# Patient Record
Sex: Female | Born: 1935 | Race: White | Hispanic: No | Smoking: Former smoker
Health system: Southern US, Community
[De-identification: ages and names within clinical notes are randomized; demographics above are authoritative.]

## PROBLEM LIST (undated history)

## (undated) DIAGNOSIS — I739 Peripheral vascular disease, unspecified: Secondary | ICD-10-CM

## (undated) DIAGNOSIS — E785 Hyperlipidemia, unspecified: Secondary | ICD-10-CM

## (undated) DIAGNOSIS — N200 Calculus of kidney: Secondary | ICD-10-CM

## (undated) DIAGNOSIS — E119 Type 2 diabetes mellitus without complications: Secondary | ICD-10-CM

## (undated) DIAGNOSIS — I10 Essential (primary) hypertension: Secondary | ICD-10-CM

## (undated) HISTORY — PX: ABDOMINAL HYSTERECTOMY: SHX81

## (undated) HISTORY — PX: OTHER SURGICAL HISTORY: SHX169

## (undated) HISTORY — PX: ANKLE FRACTURE SURGERY: SHX122

---

## 1999-03-19 ENCOUNTER — Encounter: Payer: Self-pay | Admitting: Emergency Medicine

## 1999-03-19 ENCOUNTER — Encounter: Payer: Self-pay | Admitting: Internal Medicine

## 1999-03-19 ENCOUNTER — Inpatient Hospital Stay (HOSPITAL_COMMUNITY): Admission: EM | Admit: 1999-03-19 | Discharge: 1999-03-23 | Payer: Self-pay | Admitting: Emergency Medicine

## 1999-03-23 ENCOUNTER — Encounter: Payer: Self-pay | Admitting: Internal Medicine

## 1999-04-02 ENCOUNTER — Encounter: Admission: RE | Admit: 1999-04-02 | Discharge: 1999-04-02 | Payer: Self-pay | Admitting: Family Medicine

## 1999-05-12 ENCOUNTER — Ambulatory Visit (HOSPITAL_COMMUNITY): Admission: RE | Admit: 1999-05-12 | Discharge: 1999-05-12 | Payer: Self-pay | Admitting: Gastroenterology

## 1999-08-17 ENCOUNTER — Encounter: Admission: RE | Admit: 1999-08-17 | Discharge: 1999-08-17 | Payer: Self-pay | Admitting: Family Medicine

## 1999-08-19 ENCOUNTER — Encounter: Admission: RE | Admit: 1999-08-19 | Discharge: 1999-08-19 | Payer: Self-pay | Admitting: *Deleted

## 1999-08-19 ENCOUNTER — Encounter: Payer: Self-pay | Admitting: Sports Medicine

## 1999-08-20 ENCOUNTER — Other Ambulatory Visit: Admission: RE | Admit: 1999-08-20 | Discharge: 1999-08-20 | Payer: Self-pay | Admitting: *Deleted

## 1999-08-20 ENCOUNTER — Encounter: Admission: RE | Admit: 1999-08-20 | Discharge: 1999-08-20 | Payer: Self-pay | Admitting: Family Medicine

## 1999-08-27 ENCOUNTER — Encounter: Admission: RE | Admit: 1999-08-27 | Discharge: 1999-08-27 | Payer: Self-pay | Admitting: Family Medicine

## 2000-10-18 ENCOUNTER — Ambulatory Visit (HOSPITAL_COMMUNITY): Admission: RE | Admit: 2000-10-18 | Discharge: 2000-10-18 | Payer: Self-pay | Admitting: Obstetrics and Gynecology

## 2001-08-18 ENCOUNTER — Encounter: Payer: Self-pay | Admitting: Geriatric Medicine

## 2001-08-18 ENCOUNTER — Encounter: Admission: RE | Admit: 2001-08-18 | Discharge: 2001-08-18 | Payer: Self-pay | Admitting: Geriatric Medicine

## 2001-09-12 ENCOUNTER — Inpatient Hospital Stay (HOSPITAL_COMMUNITY): Admission: RE | Admit: 2001-09-12 | Discharge: 2001-09-14 | Payer: Self-pay | Admitting: Obstetrics and Gynecology

## 2002-11-20 ENCOUNTER — Encounter: Payer: Self-pay | Admitting: Geriatric Medicine

## 2002-11-20 ENCOUNTER — Encounter: Admission: RE | Admit: 2002-11-20 | Discharge: 2002-11-20 | Payer: Self-pay | Admitting: Geriatric Medicine

## 2004-09-15 ENCOUNTER — Encounter: Admission: RE | Admit: 2004-09-15 | Discharge: 2004-09-15 | Payer: Self-pay | Admitting: Geriatric Medicine

## 2004-09-18 ENCOUNTER — Inpatient Hospital Stay: Payer: Self-pay | Admitting: Internal Medicine

## 2004-09-18 ENCOUNTER — Encounter: Admission: RE | Admit: 2004-09-18 | Discharge: 2004-09-18 | Payer: Self-pay | Admitting: Geriatric Medicine

## 2005-03-04 ENCOUNTER — Ambulatory Visit: Payer: Self-pay | Admitting: Internal Medicine

## 2005-03-12 ENCOUNTER — Ambulatory Visit: Payer: Self-pay | Admitting: Internal Medicine

## 2005-10-19 ENCOUNTER — Inpatient Hospital Stay: Payer: Self-pay | Admitting: Internal Medicine

## 2005-12-06 ENCOUNTER — Ambulatory Visit: Payer: Self-pay | Admitting: Urology

## 2005-12-06 ENCOUNTER — Other Ambulatory Visit: Payer: Self-pay

## 2005-12-15 ENCOUNTER — Inpatient Hospital Stay: Payer: Self-pay | Admitting: Urology

## 2005-12-22 ENCOUNTER — Ambulatory Visit: Payer: Self-pay | Admitting: Urology

## 2006-01-03 ENCOUNTER — Emergency Department: Payer: Self-pay | Admitting: Emergency Medicine

## 2006-07-28 ENCOUNTER — Ambulatory Visit: Payer: Self-pay | Admitting: Internal Medicine

## 2007-11-12 IMAGING — XA IR NEPHROSTOMY
1 series · 11 of 11 positions shown · non-contrast
Comparison: none

REASON FOR EXAM: post pcnl
COMMENTS:

[Series 1: run · 11 of 11 slices shown]
[im 1/11]
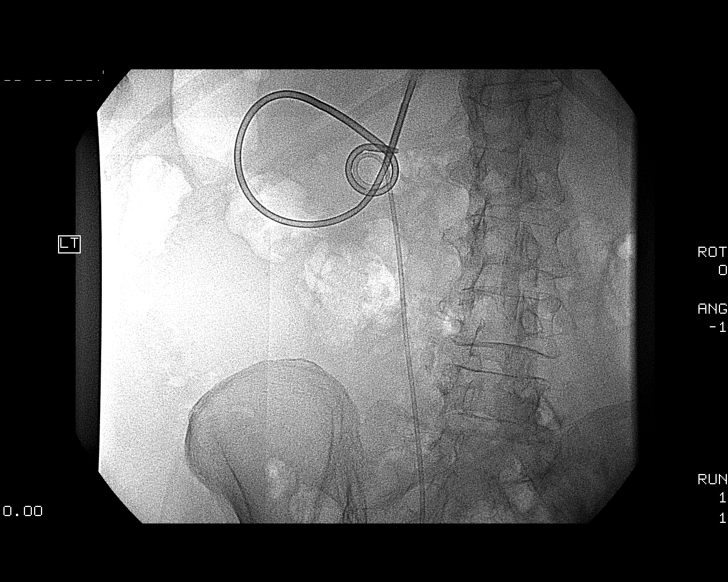
[im 2/11]
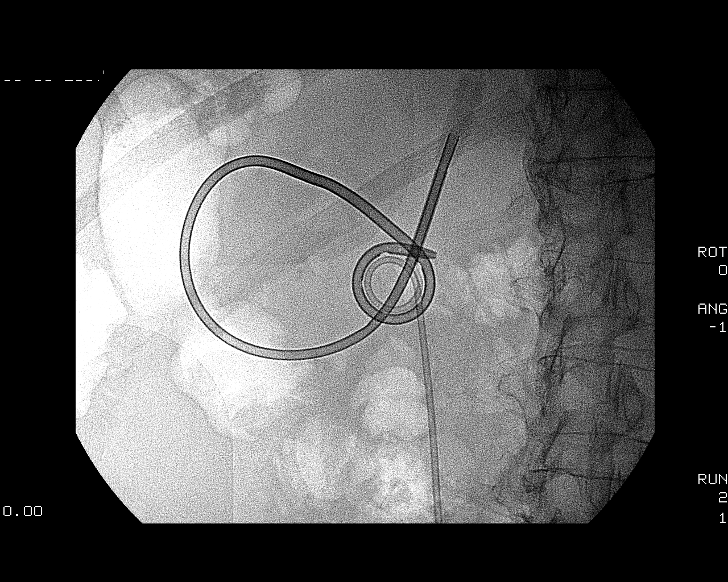
[im 3/11]
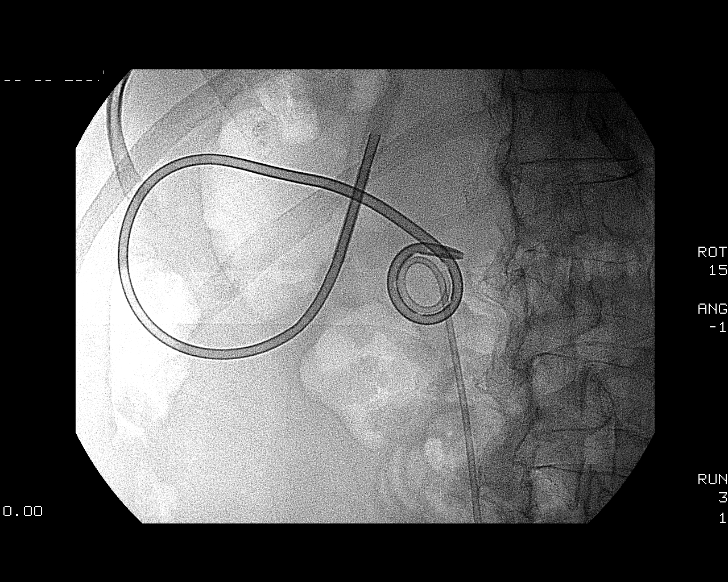
[im 4/11]
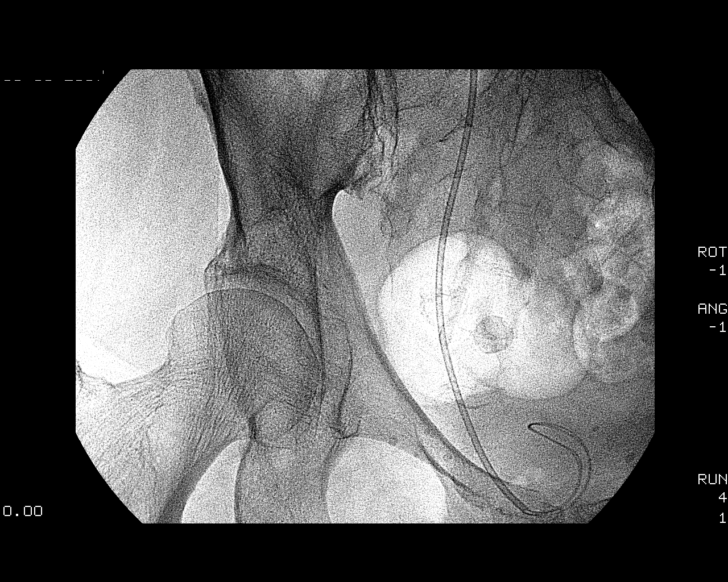
[im 5/11]
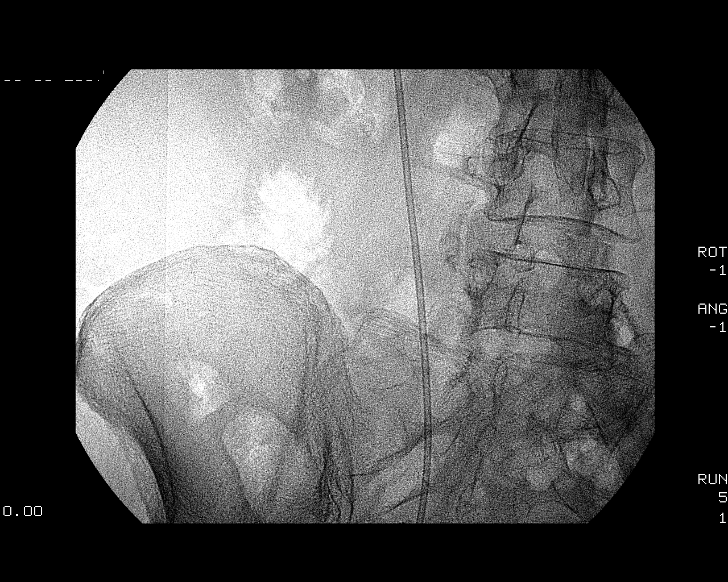
[im 6/11]
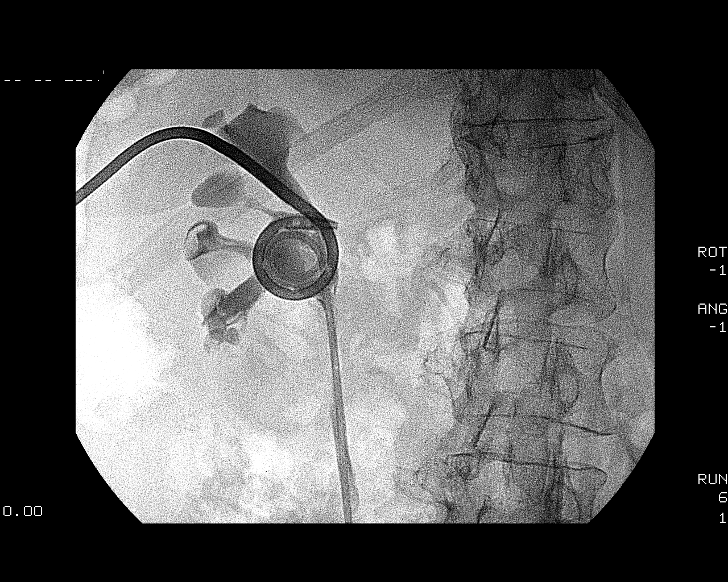
[im 7/11]
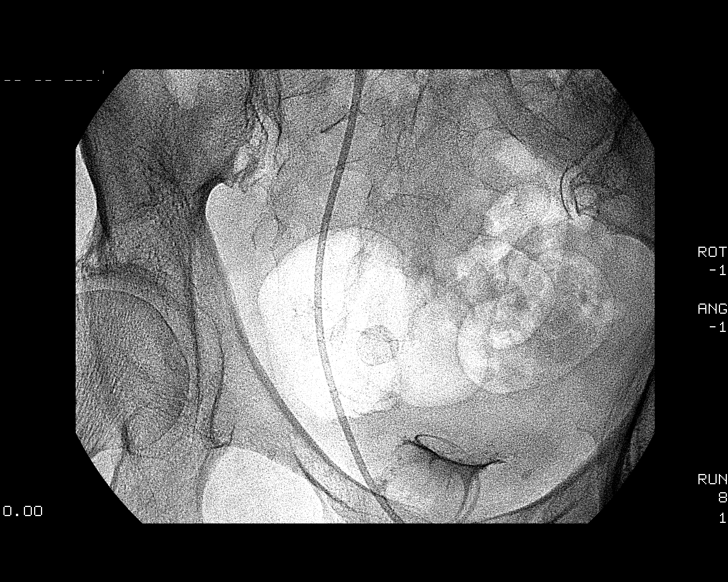
[im 8/11]
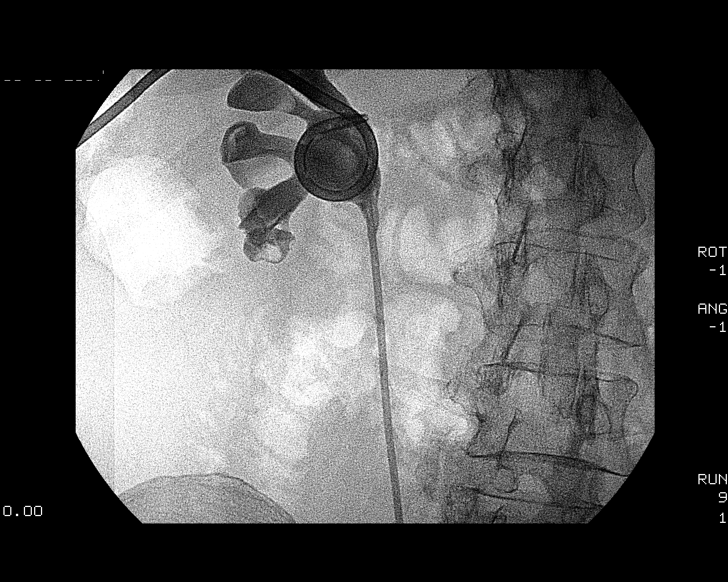
[im 9/11]
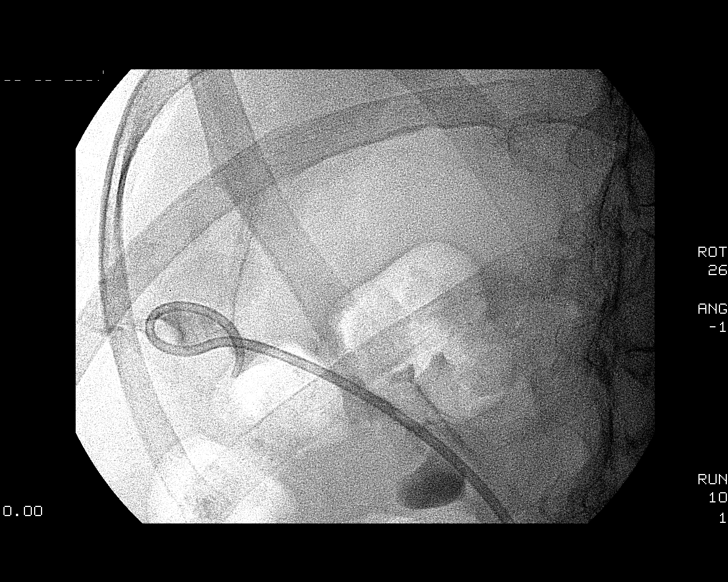
[im 10/11]
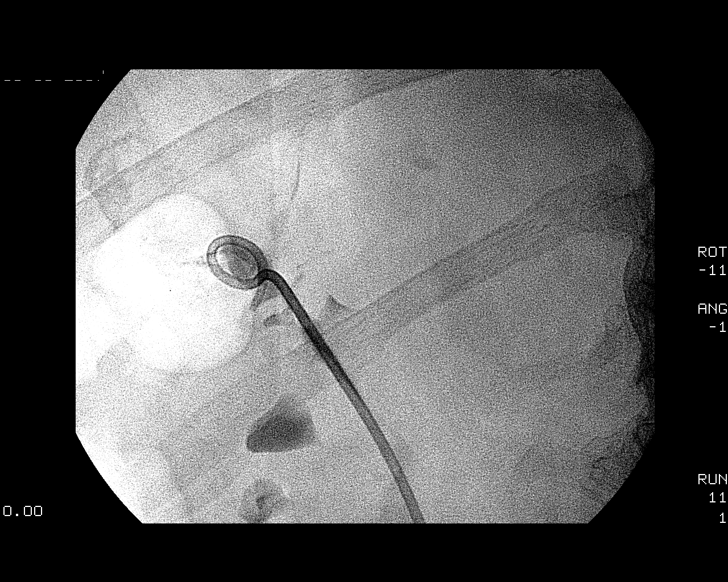
[im 11/11]
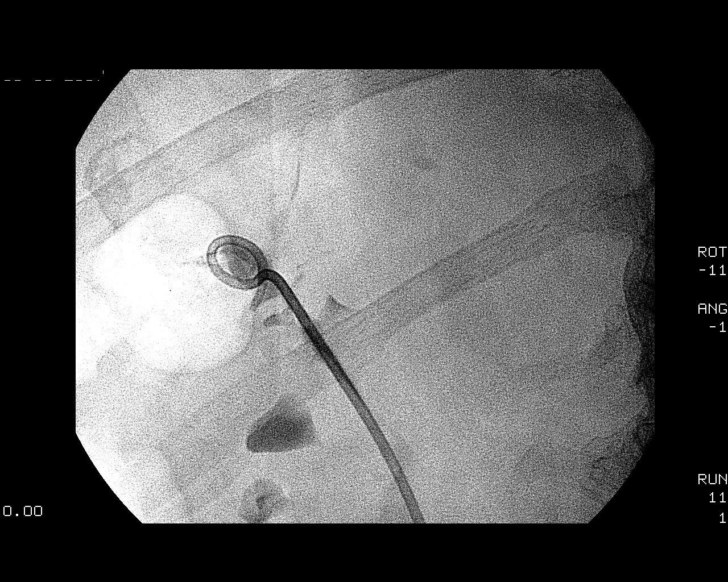

[11 of 11 positions shown; findings below may reference images not displayed]

PROCEDURE:     VAS - NEPHROSTOGRAM  - December 16, 2005 [DATE]

RESULT:     A nephrostogram was performed through the existing nephrostomy
catheter.  The renal collecting system and ureter are widely patent. No
prominent stones are present.  Therefore, as requested, the nephrostomy
catheter was removed.  There are no complications. It should be noted that
the existing ureteral stent was pulled slightly proximally by the
nephrostomy catheter, but its tip still remained in the upper pole
collecting system.  This was discussed with Dr. Azhar at the time of the
procedure
IMPRESSION: 1)Successful nephrostogram with wide patency of the renal collecting system
and ureter.

## 2008-06-25 ENCOUNTER — Ambulatory Visit: Payer: Self-pay | Admitting: Physician Assistant

## 2008-10-02 ENCOUNTER — Inpatient Hospital Stay: Payer: Self-pay | Admitting: Internal Medicine

## 2009-01-03 ENCOUNTER — Ambulatory Visit: Payer: Self-pay | Admitting: Internal Medicine

## 2009-01-10 ENCOUNTER — Ambulatory Visit: Payer: Self-pay | Admitting: Internal Medicine

## 2009-04-22 ENCOUNTER — Ambulatory Visit: Payer: Self-pay | Admitting: Urology

## 2009-04-24 ENCOUNTER — Ambulatory Visit: Payer: Self-pay | Admitting: Urology

## 2009-05-16 ENCOUNTER — Ambulatory Visit: Payer: Self-pay | Admitting: Urology

## 2009-05-21 ENCOUNTER — Ambulatory Visit: Payer: Self-pay | Admitting: Urology

## 2009-05-29 ENCOUNTER — Ambulatory Visit: Payer: Self-pay | Admitting: Urology

## 2009-06-12 ENCOUNTER — Ambulatory Visit: Payer: Self-pay | Admitting: Urology

## 2009-06-17 ENCOUNTER — Ambulatory Visit: Payer: Self-pay | Admitting: Urology

## 2009-10-07 ENCOUNTER — Ambulatory Visit: Payer: Self-pay | Admitting: Urology

## 2010-02-10 ENCOUNTER — Ambulatory Visit: Payer: Self-pay | Admitting: Internal Medicine

## 2010-09-15 ENCOUNTER — Ambulatory Visit: Payer: Self-pay | Admitting: Unknown Physician Specialty

## 2010-09-16 LAB — PATHOLOGY REPORT

## 2011-06-24 ENCOUNTER — Ambulatory Visit: Payer: Self-pay | Admitting: Internal Medicine

## 2012-07-26 ENCOUNTER — Ambulatory Visit: Payer: Self-pay | Admitting: Internal Medicine

## 2013-02-04 LAB — BASIC METABOLIC PANEL
Anion Gap: 5 — ABNORMAL LOW (ref 7–16)
BUN: 11 mg/dL (ref 7–18)
Chloride: 107 mmol/L (ref 98–107)
Co2: 29 mmol/L (ref 21–32)
Creatinine: 0.87 mg/dL (ref 0.60–1.30)
EGFR (Non-African Amer.): >60
Potassium: 4.4 mmol/L (ref 3.5–5.1)

## 2013-02-04 LAB — CBC
MCH: 32.9 pg (ref 26.0–34.0)
MCHC: 33.6 g/dL (ref 32.0–36.0)
MCV: 98 fL (ref 80–100)
Platelet: 183 10*3/uL (ref 150–440)
RBC: 3.98 10*6/uL (ref 3.80–5.20)
WBC: 14.3 10*3/uL — ABNORMAL HIGH (ref 3.6–11.0)

## 2013-02-05 ENCOUNTER — Observation Stay: Payer: Self-pay | Admitting: Internal Medicine

## 2013-02-06 LAB — CBC WITH DIFFERENTIAL/PLATELET
Eosinophil %: 0.9 %
HCT: 34.5 % — ABNORMAL LOW (ref 35.0–47.0)
HGB: 11.8 g/dL — ABNORMAL LOW (ref 12.0–16.0)
Lymphocyte %: 28.7 %
MCHC: 34.2 g/dL (ref 32.0–36.0)
MCV: 98 fL (ref 80–100)
Monocyte %: 7.8 %
Neutrophil #: 5.2 10*3/uL (ref 1.4–6.5)
Platelet: 161 10*3/uL (ref 150–440)
RBC: 3.51 10*6/uL — ABNORMAL LOW (ref 3.80–5.20)

## 2013-02-06 LAB — BASIC METABOLIC PANEL
Anion Gap: 4 — ABNORMAL LOW (ref 7–16)
Co2: 31 mmol/L (ref 21–32)
EGFR (African American): 60 — ABNORMAL LOW
Potassium: 4.4 mmol/L (ref 3.5–5.1)

## 2013-02-06 LAB — MAGNESIUM: Magnesium: 2 mg/dL

## 2013-10-01 ENCOUNTER — Ambulatory Visit: Payer: Self-pay | Admitting: Internal Medicine

## 2014-01-19 DIAGNOSIS — E119 Type 2 diabetes mellitus without complications: Secondary | ICD-10-CM | POA: Insufficient documentation

## 2014-01-19 DIAGNOSIS — I10 Essential (primary) hypertension: Secondary | ICD-10-CM | POA: Insufficient documentation

## 2014-11-29 NOTE — H&P (Signed)
PATIENT NAME:  [name] SachsLEWIS, [name] MR#:  [id number] DATE OF BIRTH:  08/16/1931  DATE OF ADMISSION:  02/05/2013  REFERRING PHYSICIAN: Dr. Margarita Lucas.   PRIMARY CARE PHYSICIAN: Dr. Einar CrowMarshall Lucas.   CHIEF COMPLAINT: Pain management.   HISTORY OF PRESENT ILLNESS: The patient is a 81 year old female with past medical history of hypertension, diabetes mellitus and hyperlipidemia. Was brought into the ER by her daughter after she sustained a fall and unable to ambulate. The patient went to the bathroom and while on her way back to her room, the patient's feet gave out and she fell and landed on her back. Denies any head trauma or injury. Following the fall, the patient was unable to get up, and she rested on the floor for a while. Eventually, she could not ambulate. Her daughter brought her into the ER, and the patient is diagnosed with a left-sided bimalleolar ankle fracture. The patient was evaluated by orthopedics and as this is nonoperable per ortho's opinion, hospitalist team is consulted regarding pain management and possible placement. During my examination, the patient denies any pain but complaining of nausea. Denies any chest pain or shortness of breath. Daughter and the patient both prefer going home after pain management and they are refusing placement at this point. No other complaints.    PAST MEDICAL HISTORY: Diabetes mellitus, hypertension, hyperlipidemia.   PAST SURGICAL HISTORY: Right ankle surgery following fracture, lithotripsy, hysterectomy.   ALLERGIES: She is allergic to CIPROFLOXACIN, SULFA DRUGS.    PSYCHOSOCIAL HISTORY: Lives with daughter. No smoking, alcohol or illicit drug usage.   FAMILY HISTORY: Father had colon cancer.   REVIEW OF SYSTEMS:  CONSTITUTIONAL: Denies any fever, fatigue. No pain.  EYES: Denies any double vision, blurry vision.  ENT: Denies any epistaxis, discharge, snoring, postnasal drip.  RESPIRATION: No COPD, wheezing, hemoptysis.  CARDIOVASCULAR: No chest  pain, palpitations, syncope.  GASTROINTESTINAL: Complaining of nausea. No vomiting or diarrhea. Denies any jaundice.  GENITOURINARY: No dysuria or hematuria.  GYNECOLOGY AND BREASTS: Denies breast mass or vaginal discharge. Status post hysterectomy.  ENDOCRINE: No polyuria or nocturia. Has history of diabetes mellitus. Denies any thyroid problems.  HEMATOLOGIC AND LYMPHATIC: Denies anemia, easy bruising or bleeding.  INTEGUMENTARY: No acne, rash, lesions.  MUSCULOSKELETAL: Denies any gout. Denies any back pain or shoulder pain.  NEUROLOGIC: Denies any history of vertigo, ataxia, dementia.  PSYCHIATRIC: No history of ADD, OCD, bipolar disorder.   PHYSICAL EXAMINATION:  VITAL SIGNS: Temperature 98.3, pulse 80 to 89, blood pressure initially 196/88 but eventually trended down to 166/73, respiratory rate 20, pulse ox 93% on room air.  GENERAL APPEARANCE: Not in any acute distress, resting comfortably. Moderately built and obese.  HEENT: Normocephalic. Pupils are equally reacting to light and accommodation. No scleral icterus. No conjunctival injection. No sinus tenderness. No postnasal drip. No pharyngeal exudate.  NECK: Supple. No JVD. No thyromegaly. No lymphadenopathy.  LUNGS: Clear to auscultation bilaterally. No accessory muscle use. No anterior chest wall tenderness on palpation.  CARDIAC: S1, S2 normal. Regular rate and rhythm. No gallops.  GASTROINTESTINAL: Soft. Bowel sounds are positive in all 4 quadrants. Nontender, nondistended. No masses felt. No hepatosplenomegaly.  NEUROLOGIC: Awake, alert, oriented x3. Cranial nerves II through XII are grossly intact. Motor and sensory are grossly intact. Reflexes are 2+.  SKIN: Warm to touch. Normal turgor. No rashes. No lesions.  BACK: No CVA tenderness.  EXTREMITIES: No edema. No cyanosis. No clubbing.  MUSCULOSKELETAL: The patient has sustained a left-sided bimalleolar ankle fracture which is nonoperable. Otherwise, no  joint effusion or  erythema. Tenderness in the left ankle.  PSYCHIATRIC: Normal mood and affect.   LABS AND IMAGING STUDIES: Glucose 143, BUN 11, creatinine 0.87, sodium 141, potassium 4.4, chloride 107, CO2 29. GFR greater than 60. Anion gap is 5. Serum osmolality 283. Calcium 9.4. WBC 14.3, hemoglobin 13.1, hematocrit 39.0, platelets 183. Left ankle x-ray revealed sustained spiral fracture of the distal fibular metaphysis. Horizontally oriented medial malleolar fracture with overlying soft tissue swelling. RIGHT ANKLE X-RAY: No evidence of acute fracture of the right ankle.   ASSESSMENT AND PLAN:  1. Left bimalleolar ankle fracture which is nonoperable as reported by Dr. Rosita Kea, orthopedics. Will provide pain management, and physical therapy consult is placed. Consult is placed to case management as well regarding discharge planning.  2. Hypertension: It is elevated, probably related with pain. Continue pain management and blood pressure medication.  3. Diabetes mellitus: The patient will be on sliding scale insulin.  4. Hyperlipidemia: Resume home medication.  5. Will provide her gastrointestinal and deep vein thrombosis prophylaxis.  6. Will transfer the patient to [facility] in a.m. to Dr. Gaynell Face Lucas's service.   The plan of care was discussed in detail with the patient. The patient is aware of the plan.   TOTAL TIME SPENT ON THE ADMISSION: 45 minutes in reviewing old medical records, discussion with the patient and ER staff, as well as dictation.   ____________________________ Ramonita Lab, MD ag:gb D: 02/05/2013 02:12:53 ET T: 02/05/2013 03:34:34 ET JOB#: [id number]  cc: Ramonita Lab, MD, <Dictator> Marya Amsler. Dareen Piano, MD Ramonita Lab MD ELECTRONICALLY SIGNED 02/16/2013 7:44

## 2014-11-29 NOTE — Consult Note (Signed)
Brief Consult Note: Diagnosis: Left bimalleolar ankle fracture, nondisplaced.   Patient was seen by consultant.   Recommend further assessment or treatment.   Discussed with Attending MD.   Comments: Discussed this case with Dr. Kennedy BuckerMichael Menz.  I am going to assume care for this patient.  Patient had a mechanical fall at home and was unable to stand after the injury.  Patient diagnosed with nondisplaced fracture of the left ankle and admitted to medical service.  Patient was splinted in the ER.  Patient cleared for discharge today by Dr. Dareen PianoAnderson.  Patient  being discharged home.  Daughter and grandson are at the patient's bedside.  Patient's medical history has been reviewed on EMR.  On exam, her skin is intact.  Swelling is controlled.  Patient can flex and extend toes weakly bilaterally which is her baseline.  She also has mild paresthesias bilaterally which is her baseline.  Patient is boarderline diabetic by her report.  She has palpable pedal pulses and no calf tenderness tenderness.  Xrays reveal two screws in right ankle with arthrosis changes in the right ankle joint, but no new fractures or dislocations.  The left ankle views show nondisplaced fractures of the medial and lateral malleoli which a symmetric mortise and no widening of the syndesmosis.  I have changed the patient to a short leg cast.  She is to remain [facility] until she follows up in my office in 2 weeks.  Electronic Signatures: Juanell FairlyKrasinski, Krysti Hickling (MD)  (Signed 01-Jul-14 13:09)  Authored: Brief Consult Note   Last Updated: 01-Jul-14 13:09 by Juanell FairlyKrasinski, Ijeoma Loor (MD)

## 2016-04-10 ENCOUNTER — Emergency Department
Admission: EM | Admit: 2016-04-10 | Discharge: 2016-04-10 | Disposition: A | Discharge status: Home / Self Care | Payer: Medicare Other | Attending: Emergency Medicine | Admitting: Emergency Medicine

## 2016-04-10 ENCOUNTER — Encounter: Payer: Self-pay | Admitting: Emergency Medicine

## 2016-04-10 ENCOUNTER — Emergency Department: Payer: Medicare Other

## 2016-04-10 DIAGNOSIS — Z79899 Other long term (current) drug therapy: Secondary | ICD-10-CM | POA: Diagnosis not present

## 2016-04-10 DIAGNOSIS — Z87891 Personal history of nicotine dependence: Secondary | ICD-10-CM | POA: Diagnosis not present

## 2016-04-10 DIAGNOSIS — E860 Dehydration: Secondary | ICD-10-CM

## 2016-04-10 DIAGNOSIS — E119 Type 2 diabetes mellitus without complications: Secondary | ICD-10-CM | POA: Insufficient documentation

## 2016-04-10 DIAGNOSIS — I10 Essential (primary) hypertension: Secondary | ICD-10-CM | POA: Diagnosis not present

## 2016-04-10 DIAGNOSIS — E876 Hypokalemia: Secondary | ICD-10-CM | POA: Insufficient documentation

## 2016-04-10 DIAGNOSIS — K5732 Diverticulitis of large intestine without perforation or abscess without bleeding: Secondary | ICD-10-CM | POA: Diagnosis not present

## 2016-04-10 DIAGNOSIS — R112 Nausea with vomiting, unspecified: Secondary | ICD-10-CM | POA: Diagnosis present

## 2016-04-10 HISTORY — DX: Calculus of kidney: N20.0

## 2016-04-10 HISTORY — DX: Essential (primary) hypertension: I10

## 2016-04-10 HISTORY — DX: Type 2 diabetes mellitus without complications: E11.9

## 2016-04-10 LAB — CBC
HEMATOCRIT: 38.1 % (ref 35.0–47.0)
Hemoglobin: 13.2 g/dL (ref 12.0–16.0)
MCH: 33.7 pg (ref 26.0–34.0)
MCHC: 34.8 g/dL (ref 32.0–36.0)
MCV: 96.8 fL (ref 80.0–100.0)
Platelets: 172 10*3/uL (ref 150–440)
RBC: 3.93 MIL/uL (ref 3.80–5.20)
RDW: 13.4 % (ref 11.5–14.5)
WBC: 10.5 10*3/uL (ref 3.6–11.0)

## 2016-04-10 LAB — URINALYSIS COMPLETE WITH MICROSCOPIC (ARMC ONLY)
BILIRUBIN URINE: NEGATIVE
GLUCOSE, UA: NEGATIVE mg/dL
Hgb urine dipstick: NEGATIVE
Ketones, ur: NEGATIVE mg/dL
Nitrite: NEGATIVE
Protein, ur: NEGATIVE mg/dL
Specific Gravity, Urine: 1.015 (ref 1.005–1.030)
pH: 5 (ref 5.0–8.0)

## 2016-04-10 LAB — COMPREHENSIVE METABOLIC PANEL
ALT: 12 U/L — ABNORMAL LOW (ref 14–54)
AST: 16 U/L (ref 15–41)
Albumin: 3.8 g/dL (ref 3.5–5.0)
Alkaline Phosphatase: 66 U/L (ref 38–126)
Anion gap: 9 (ref 5–15)
BILIRUBIN TOTAL: 0.5 mg/dL (ref 0.3–1.2)
BUN: 19 mg/dL (ref 6–20)
CO2: 19 mmol/L — ABNORMAL LOW (ref 22–32)
CREATININE: 0.97 mg/dL (ref 0.44–1.00)
Calcium: 9.1 mg/dL (ref 8.9–10.3)
Chloride: 110 mmol/L (ref 101–111)
GFR calc Af Amer: >60 mL/min (ref >60)
GFR, EST NON AFRICAN AMERICAN: 53 mL/min — AB (ref >60)
Glucose, Bld: 144 mg/dL — ABNORMAL HIGH (ref 65–99)
POTASSIUM: 3.1 mmol/L — AB (ref 3.5–5.1)
Sodium: 138 mmol/L (ref 135–145)
TOTAL PROTEIN: 7.3 g/dL (ref 6.5–8.1)

## 2016-04-10 LAB — LIPASE, BLOOD: LIPASE: 47 U/L (ref 11–51)

## 2016-04-10 MED ORDER — POTASSIUM CHLORIDE 40 MEQ/15ML (20%) PO SOLN: 5.0000 mL | Freq: Every day | ORAL | 0 refills | Status: DC

## 2016-04-10 MED ORDER — MAGNESIUM SULFATE IN D5W 1-5 GM/100ML-% IV SOLN
1.0000 g | Freq: Once | INTRAVENOUS | Status: AC
  Administered 2016-04-10: 1 g via INTRAVENOUS
  Filled 2016-04-10: qty 100

## 2016-04-10 MED ORDER — ONDANSETRON HCL 4 MG/2ML IJ SOLN
4.0000 mg | Freq: Once | INTRAMUSCULAR | Status: AC
  Administered 2016-04-10: 4 mg via INTRAVENOUS
  Filled 2016-04-10: qty 2

## 2016-04-10 MED ORDER — CIPROFLOXACIN HCL 500 MG PO TABS: 500.0000 mg | ORAL_TABLET | Freq: Two times a day (BID) | ORAL | 0 refills | Status: DC

## 2016-04-10 MED ORDER — CIPROFLOXACIN HCL 500 MG PO TABS
500.0000 mg | ORAL_TABLET | ORAL | Status: AC
  Administered 2016-04-10: 500 mg via ORAL
  Filled 2016-04-10: qty 1

## 2016-04-10 MED ORDER — IOPAMIDOL (ISOVUE-300) INJECTION 61%
30.0000 mL | Freq: Once | INTRAVENOUS | Status: AC | PRN
  Administered 2016-04-10: 30 mL via ORAL

## 2016-04-10 MED ORDER — POTASSIUM CHLORIDE 10 MEQ/100ML IV SOLN
10.0000 meq | INTRAVENOUS | Status: AC
  Administered 2016-04-10: 10 meq via INTRAVENOUS
  Filled 2016-04-10 (×2): qty 100

## 2016-04-10 MED ORDER — ACETAMINOPHEN 160 MG/5ML PO SOLN
650.0000 mg | Freq: Once | ORAL | Status: AC
  Administered 2016-04-10: 650 mg via ORAL
  Filled 2016-04-10: qty 25

## 2016-04-10 MED ORDER — METRONIDAZOLE 500 MG PO TABS
500.0000 mg | ORAL_TABLET | ORAL | Status: AC
  Administered 2016-04-10: 500 mg via ORAL
  Filled 2016-04-10: qty 1

## 2016-04-10 MED ORDER — IOPAMIDOL (ISOVUE-300) INJECTION 61%
100.0000 mL | Freq: Once | INTRAVENOUS | Status: AC | PRN
  Administered 2016-04-10: 100 mL via INTRAVENOUS

## 2016-04-10 MED ORDER — ONDANSETRON 4 MG PO TBDP: 4.0000 mg | ORAL_TABLET | Freq: Four times a day (QID) | ORAL | 0 refills | Status: DC | PRN

## 2016-04-10 MED ORDER — METRONIDAZOLE 500 MG PO TABS: 500.0000 mg | ORAL_TABLET | Freq: Three times a day (TID) | ORAL | 0 refills | Status: DC

## 2016-04-10 MED ORDER — SODIUM CHLORIDE 0.9 % IV BOLUS (SEPSIS)
1000.0000 mL | Freq: Once | INTRAVENOUS | Status: AC
  Administered 2016-04-10: 1000 mL via INTRAVENOUS

## 2016-04-10 NOTE — ED Notes (Signed)
Pt states she has been having diarrhea x 2 weeks. Has tried multiple OTC meds to stop diarrhea but has not helped.  Vomiting started this morning x2 which pt describes as white in appearance and sour tasting.  Pt having increased weakness as well.  Pt states this morning she felt like she was going to pass out.  Pt has had abdominal pain as well which comes and goes.  Pt has hx of esophageal stretching and polpys in colon as well. Pt has not taken her meds this morning. Per daughter, pt has appt Tuesday with PCP.

## 2016-04-10 NOTE — Discharge Instructions (Signed)
We believe your symptoms are caused by diverticulitis.  Most of the time this condition (please read through the included information) can be cured with outpatient antibiotics.  Please take the full course of prescribed medication(s) and follow up with the doctors recommended above.  Return to the ED if your abdominal pain worsens or fails to improve, you develop bloody vomiting, bloody diarrhea, you are unable to tolerate fluids due to vomiting, fever greater than 101, or other symptoms that concern you.  Follow-up with your doctor on Tuesday as planned.

## 2016-04-10 NOTE — ED Provider Notes (Signed)
[facility] Emergency Department Provider Note   ____________________________________________   First MD Initiated Contact with Patient 04/10/16 [phone]     (approximate)  I have reviewed the triage vital signs and the nursing notes.   HISTORY  Chief Complaint Emesis and Diarrhea    HPI [name] Lucas is a 82 y.o. female presents for evaluation of "diarrhea". And vomiting  Patient and her daughter both present, both very pleasant and report that for the last 2 weeks she's had about 2 loose nonbloody, nonblack stools 1 about 4 in the morning and then occasionally another one in the mid morning. In associated with some occasional intermittent lower abdominal discomfort, and then this morning she got up she vomited twice which she describes as a sort of whitish color nonbloody.  Not in any pain, no nausea though she had some this morning before vomiting. No recent antibiotics. No recent infections. No fevers. She came today as she is starting to feel "lightheaded" when she is walking in the house. She feels "dehydrated"  No chest pain, shortness of breath, or headache. No travel. No recent antibiotics or hospital stay.  She is a diabetic. Reports compliant with her medicine.  Past Medical History:  Diagnosis Date  . Diabetes mellitus without complication (HCC)   . Hypertension   . Kidney stones     There are no active problems to display for this patient.   Past Surgical History:  Procedure Laterality Date  . ABDOMINAL HYSTERECTOMY    . ANKLE FRACTURE SURGERY Bilateral   . esophageal dilitation    . kidney stone removal      Prior to Admission medications   Medication Sig Start Date End Date Taking? Authorizing Provider  ciprofloxacin (CIPRO) 500 MG tablet Take 1 tablet (500 mg total) by mouth 2 (two) times daily. 04/10/16   Sharyn Creamer, MD  metroNIDAZOLE (FLAGYL) 500 MG tablet Take 1 tablet (500 mg total) by mouth 3 (three) times daily. 04/10/16    Sharyn Creamer, MD  ondansetron (ZOFRAN ODT) 4 MG disintegrating tablet Take 1 tablet (4 mg total) by mouth every 6 (six) hours as needed for nausea or vomiting. 04/10/16   Sharyn Creamer, MD  Potassium Chloride 40 MEQ/15ML (20%) SOLN Take 5 mLs by mouth daily. 04/10/16   Sharyn Creamer, MD    Allergies Sulfa antibiotics  No family history on file.  Social History Social History  Substance Use Topics  . Smoking status: Former Games developer  . Smokeless tobacco: Not on file  . Alcohol use No    Review of Systems Constitutional: No fever/chills Eyes: No visual changes. ENT: No sore throat. Cardiovascular: Denies chest pain. Respiratory: Denies shortness of breath. Gastrointestinal: No abdominal pain at present though will have a slight crampy discomfort over the lower abdomen at times over the last 2 weeks.    No constipation. Genitourinary: Negative for dysuria. Musculoskeletal: Negative for back pain. Skin: Negative for rash. Neurological: Negative for headaches, focal weakness or numbness.  10-point ROS otherwise negative.  ____________________________________________   PHYSICAL EXAM:  VITAL SIGNS: ED Triage Vitals  Enc Vitals Group     BP 04/10/16 0817 (!) 140/57     Pulse Rate 04/10/16 0817 76     Resp 04/10/16 0817 18     Temp 04/10/16 0817 98 F (36.7 C)     Temp Source 04/10/16 0817 Oral     SpO2 04/10/16 0817 95 %     Weight 04/10/16 0819 169 lb (76.7 kg)  Height 04/10/16 0819 5\' 3"  (1.6 m)     Head Circumference --      Peak Flow --      Pain Score --      Pain Loc --      Pain Edu? --      Excl. in GC? --     Constitutional: Alert and oriented. Well appearing and in no acute distress. Eyes: Conjunctivae are normal. PERRL. EOMI. Head: Atraumatic. Nose: No congestion/rhinnorhea. Mouth/Throat: Mucous membranes are Modestly dry.  Oropharynx non-erythematous. Neck: No stridor.   Cardiovascular: Normal rate, regular rhythm. Grossly normal heart sounds.  Good peripheral  circulation. Respiratory: Normal respiratory effort.  No retractions. Lungs CTAB. Gastrointestinal: Soft and nontender except for some mild discomfort without evidence of peritonitis suprapubically and over the right lower abdomen. No distention. No abdominal bruits. No CVA tenderness. Musculoskeletal: No lower extremity tenderness nor edema.  No joint effusions. Neurologic:  Normal speech and language. No gross focal neurologic deficits are appreciated. Normal cranial nerves. No pronator drift. Skin:  Skin is warm, dry and intact. No rash noted. Slight increase in skin turgor. Psychiatric: Mood and affect are normal. Speech and behavior are normal.  ____________________________________________   LABS (all labs ordered are listed, but only abnormal results are displayed)  Labs Reviewed  COMPREHENSIVE METABOLIC PANEL - Abnormal; Notable for the following:       Result Value   Potassium 3.1 (*)    CO2 19 (*)    Glucose, Bld 144 (*)    ALT 12 (*)    GFR calc non Af Amer 53 (*)    All other components within normal limits  URINALYSIS COMPLETEWITH MICROSCOPIC ([facility] ONLY) - Abnormal; Notable for the following:    Color, Urine YELLOW (*)    APPearance HAZY (*)    Leukocytes, UA 1+ (*)    Bacteria, UA RARE (*)    Squamous Epithelial / LPF 0-5 (*)    All other components within normal limits  GASTROINTESTINAL PANEL BY PCR, STOOL (REPLACES STOOL CULTURE)  C DIFFICILE QUICK SCREEN W PCR REFLEX  CBC  LIPASE, BLOOD   ____________________________________________  EKG  Reviewed and to revive me at 8:40 AM Heart rate 75 QRS 120 QTc 480 Reviewed and interpreted as normal sinus rhythm, moderate evidence of left ventricular hypertrophy without ischemic abnormality ____________________________________________  RADIOLOGY  Ct Abdomen Pelvis W Contrast  Result Date: 04/10/2016 CLINICAL DATA:  Nausea, vomiting, diarrhea, lower abdominal pain. History of renal calculi and prior  hysterectomy. EXAM: CT ABDOMEN AND PELVIS WITH CONTRAST TECHNIQUE: Multidetector CT imaging of the abdomen and pelvis was performed using the standard protocol following bolus administration of intravenous contrast. CONTRAST:  100mL ISOVUE-300 IOPAMIDOL (ISOVUE-300) INJECTION 61%, 30mL ISOVUE-300 IOPAMIDOL (ISOVUE-300) INJECTION 61% COMPARISON:  None. FINDINGS: Lower chest:  No acute findings. Hepatobiliary: The liver demonstrates multiple punctate calcified granulomata. Inferior right lobe shows a subcapsular small cyst measuring 6 mm. The gallbladder and biliary tree are unremarkable. Pancreas: The pancreas appears atrophic.  No inflammation or mass. Spleen: Within normal limits in size and appearance. Adrenals/Urinary Tract: The adrenal glands appear normal. The left kidney is markedly atrophic. Both kidneys demonstrate no evidence of hydronephrosis or visualize calculi. Ureters and bladder are unremarkable with decompression of the bladder. Stomach/Bowel: Large hiatal hernia present containing a portion of the proximal stomach as well as fat. There is some fluid in the posterior and right aspect of the hiatal hernia sac which is nonspecific and likely not of significance. Diffuse diverticulosis noted  of the colon. Focal inflammation along the anterior and superior aspect of the mid sigmoid colon likely is reflective of mild acute diverticulitis. No extraluminal air or associated focal abscess is identified. Component of esophagitis or even gastritis of the proximal stomach cannot be excluded. Vascular/Lymphatic: No enlarged lymph nodes are seen. The abdominal aorta shows calcified plaque without evidence of aneurysm. Reproductive: No mass or other significant abnormality. Other: None. Musculoskeletal:  No suspicious bone lesions identified. IMPRESSION: 1. Evidence of sigmoid colonic diverticulitis without free air or focal abscess. 2. Large hiatal hernia containing the proximal stomach as well as fat. The hernia  sac contains some dependent fluid which is nonspecific but could potentially imply a component of proximal gastritis or distal esophagitis. Electronically Signed   By: Irish Lack M.D.   On: 04/10/2016 10:59    ____________________________________________   PROCEDURES  Procedure(s) performed: None  Procedures  Critical Care performed: No  ____________________________________________   INITIAL IMPRESSION / ASSESSMENT AND PLAN / ED COURSE  Pertinent labs & imaging results that were available during my care of the patient were reviewed by me and considered in my medical decision making (see chart for details).  Differential diagnosis includes but is not limited to, abdominal perforation, aortic dissection, cholecystitis, appendicitis, diverticulitis, colitis, esophagitis/gastritis, kidney stone, pyelonephritis, urinary tract infection, aortic aneurysm. All are considered in decision and treatment plan. Based upon the patient's presentation and risk factors, we will evaluate for primary abdominal etiology including infection. Symptoms do not appear consistent with bowel obstruction, however potentially could represent a mild diverticulitis, viral process, or other infectious diarrhea suspected.  Discussed risks and benefits and will proceed with hydration, nausea medicine, and CT of the abdomen and pelvis to further evaluate. Stable hemodynamics with a relatively reassuring exam without evidence of acute abdomen. No neurologic cardiac or pulmonary symptoms.   Clinical Course   ----------------------------------------- 2:15 PM on 04/10/2016 -----------------------------------------  Patient reports she feels well. CT consistent with mild diverticulitis without evidence of perforation or complication. The patient is not having ongoing discomfort, vomiting, or evidence supporting need for inpatient hospitalization. Discussed with the patient, as well as her family at the bedside and  they're comfortable going home with a plan for close follow-up which ER he has arranged Tuesday with careful return precautions.   ____________________________________________   FINAL CLINICAL IMPRESSION(S) / ED DIAGNOSES  Final diagnoses:  Dehydration  Sigmoid diverticulitis  Hypokalemia      NEW MEDICATIONS STARTED DURING THIS VISIT:  New Prescriptions   CIPROFLOXACIN (CIPRO) 500 MG TABLET    Take 1 tablet (500 mg total) by mouth 2 (two) times daily.   METRONIDAZOLE (FLAGYL) 500 MG TABLET    Take 1 tablet (500 mg total) by mouth 3 (three) times daily.   ONDANSETRON (ZOFRAN ODT) 4 MG DISINTEGRATING TABLET    Take 1 tablet (4 mg total) by mouth every 6 (six) hours as needed for nausea or vomiting.   POTASSIUM CHLORIDE 40 MEQ/15ML (20%) SOLN    Take 5 mLs by mouth daily.     Note:  This document was prepared using Dragon voice recognition software and may include unintentional dictation errors.     Sharyn Creamer, MD 04/10/16 [phone]

## 2016-04-10 NOTE — ED Notes (Signed)
Patient transported to CT 

## 2016-04-10 NOTE — ED Notes (Signed)
MD at bedside. 

## 2016-04-10 NOTE — ED Triage Notes (Signed)
States diarrhea x 2 weeks, vomiting began yesterday, denies abd pain.

## 2016-04-10 NOTE — ED Notes (Signed)
CT notified pt completed contrast.  

## 2017-12-09 ENCOUNTER — Emergency Department: Payer: Medicare Other

## 2017-12-09 ENCOUNTER — Emergency Department
Admission: EM | Admit: 2017-12-09 | Discharge: 2017-12-09 | Disposition: A | Discharge status: Home / Self Care | Payer: Medicare Other | Attending: Emergency Medicine | Admitting: Emergency Medicine

## 2017-12-09 ENCOUNTER — Encounter: Payer: Self-pay | Admitting: Emergency Medicine

## 2017-12-09 ENCOUNTER — Other Ambulatory Visit: Payer: Self-pay

## 2017-12-09 DIAGNOSIS — S99912A Unspecified injury of left ankle, initial encounter: Secondary | ICD-10-CM | POA: Diagnosis present

## 2017-12-09 DIAGNOSIS — S93602A Unspecified sprain of left foot, initial encounter: Secondary | ICD-10-CM | POA: Diagnosis not present

## 2017-12-09 DIAGNOSIS — I10 Essential (primary) hypertension: Secondary | ICD-10-CM | POA: Insufficient documentation

## 2017-12-09 DIAGNOSIS — Z79899 Other long term (current) drug therapy: Secondary | ICD-10-CM | POA: Insufficient documentation

## 2017-12-09 DIAGNOSIS — E119 Type 2 diabetes mellitus without complications: Secondary | ICD-10-CM | POA: Diagnosis not present

## 2017-12-09 DIAGNOSIS — S80212A Abrasion, left knee, initial encounter: Secondary | ICD-10-CM | POA: Diagnosis not present

## 2017-12-09 DIAGNOSIS — S93402A Sprain of unspecified ligament of left ankle, initial encounter: Secondary | ICD-10-CM | POA: Insufficient documentation

## 2017-12-09 DIAGNOSIS — W0110XA Fall on same level from slipping, tripping and stumbling with subsequent striking against unspecified object, initial encounter: Secondary | ICD-10-CM | POA: Insufficient documentation

## 2017-12-09 DIAGNOSIS — Y939 Activity, unspecified: Secondary | ICD-10-CM | POA: Diagnosis not present

## 2017-12-09 DIAGNOSIS — S80211A Abrasion, right knee, initial encounter: Secondary | ICD-10-CM | POA: Diagnosis not present

## 2017-12-09 DIAGNOSIS — Y929 Unspecified place or not applicable: Secondary | ICD-10-CM | POA: Insufficient documentation

## 2017-12-09 DIAGNOSIS — S0990XA Unspecified injury of head, initial encounter: Secondary | ICD-10-CM | POA: Insufficient documentation

## 2017-12-09 DIAGNOSIS — Y998 Other external cause status: Secondary | ICD-10-CM | POA: Diagnosis not present

## 2017-12-09 DIAGNOSIS — Z87891 Personal history of nicotine dependence: Secondary | ICD-10-CM | POA: Insufficient documentation

## 2017-12-09 DIAGNOSIS — W19XXXA Unspecified fall, initial encounter: Secondary | ICD-10-CM

## 2017-12-09 NOTE — ED Notes (Signed)
Ace wrap applied to left ankle. Pt made aware or RICE and verbalized understanding.

## 2017-12-09 NOTE — ED Provider Notes (Signed)
[facility] Emergency Department Provider Note  ____________________________________________   First MD Initiated Contact with Patient 12/09/17 1945     (approximate)  I have reviewed the triage vital signs and the nursing notes.   HISTORY  Chief Complaint Fall   HPI [name] Lucas is a 84 y.o. female history of diabetes and hypertension who is presenting to the emergency department after a fall.  She says that she has been having weakness with frequent "giving out" of her left ankle since she broke it 2 years ago.  She says that the left ankle "gave out" this morning and she fell to her left side hitting her head on the left parietal region.  She does not report any loss of consciousness.  Denies any headache or neck pain at this time.  Denies any chest or abdominal pain.  Says that she has been experiencing worsening pain throughout the day in her left ankle as well as proximal, dorsal foot and came to the emergency department for further evaluation.  Patient is not on any blood thinners.  Past Medical History:  Diagnosis Date  . Diabetes mellitus without complication (HCC)   . Hypertension   . Kidney stones     There are no active problems to display for this patient.   Past Surgical History:  Procedure Laterality Date  . ABDOMINAL HYSTERECTOMY    . ANKLE FRACTURE SURGERY Bilateral   . esophageal dilitation    . kidney stone removal      Prior to Admission medications   Medication Sig Start Date End Date Taking? Authorizing Provider  ciprofloxacin (CIPRO) 500 MG tablet Take 1 tablet (500 mg total) by mouth 2 (two) times daily. 04/10/16   Sharyn Creamer, MD  metroNIDAZOLE (FLAGYL) 500 MG tablet Take 1 tablet (500 mg total) by mouth 3 (three) times daily. 04/10/16   Sharyn Creamer, MD  ondansetron (ZOFRAN ODT) 4 MG disintegrating tablet Take 1 tablet (4 mg total) by mouth every 6 (six) hours as needed for nausea or vomiting. 04/10/16   Sharyn Creamer, MD    Potassium Chloride 40 MEQ/15ML (20%) SOLN Take 5 mLs by mouth daily. 04/10/16   Sharyn Creamer, MD    Allergies Sulfa antibiotics  No family history on file.  Social History Social History   Tobacco Use  . Smoking status: Former Smoker  Substance Use Topics  . Alcohol use: No  . Drug use: Not on file    Review of Systems  Constitutional: No fever/chills Eyes: No visual changes. ENT: No sore throat. Cardiovascular: Denies chest pain. Respiratory: Denies shortness of breath. Gastrointestinal: No abdominal pain.  No nausea, no vomiting.  No diarrhea.  No constipation. Genitourinary: Negative for dysuria. Musculoskeletal: Negative for back pain. Skin: Negative for rash. Neurological: Negative for headaches, focal weakness or numbness.   ____________________________________________   PHYSICAL EXAM:  VITAL SIGNS: ED Triage Vitals  Enc Vitals Group     BP 12/09/17 1829 (!) 130/50     Pulse Rate 12/09/17 1829 84     Resp 12/09/17 1829 16     Temp 12/09/17 1829 99.2 F (37.3 C)     Temp Source 12/09/17 1829 Oral     SpO2 12/09/17 1829 95 %     Weight 12/09/17 1828 169 lb (76.7 kg)     Height --      Head Circumference --      Peak Flow --      Pain Score 12/09/17 1828 7  Pain Loc --      Pain Edu? --      Excl. in GC? --     Constitutional: Alert and oriented. Well appearing and in no acute distress. Eyes: Conjunctivae are normal.  Head: 2 x 3 cm raised hematoma to the left parieto-occipital region without any depression, bogginess or tenderness to palpation.  No overlying abrasion. Nose: No congestion/rhinnorhea. Mouth/Throat: Mucous membranes are moist.  Neck: No stridor.   Cardiovascular: Normal rate, regular rhythm. Grossly normal heart sounds.  Good peripheral circulation with intact dorsalis pedis pulses bilaterally. Respiratory: Normal respiratory effort.  No retractions. Lungs CTAB. Gastrointestinal: Soft and nontender. No  distention. Musculoskeletal:  Left lateral malleolus with tenderness to palpation.  Also tenderness palpation over the navicular.  Mild swelling to both the medial and lateral malleolar lie.  Patient is neurovascularly intact distal to the sites of the injuries.  Neurologic:  Normal speech and language. No gross focal neurologic deficits are appreciated. Skin: 2 x 3 cm superficial abrasions to the bilateral anterior knees.  Patient not complaining of knee pain. Psychiatric: Mood and affect are normal. Speech and behavior are normal.  ____________________________________________   LABS (all labs ordered are listed, but only abnormal results are displayed)  Labs Reviewed - No data to display ____________________________________________  EKG   ____________________________________________  RADIOLOGY  Left foot as well as ankle without acute finding.  CT brain without acute finding. ____________________________________________   PROCEDURES  Procedure(s) performed:   Procedures  Critical Care performed:   ____________________________________________   INITIAL IMPRESSION / ASSESSMENT AND PLAN / ED COURSE  Pertinent labs & imaging results that were available during my care of the patient were reviewed by me and considered in my medical decision making (see chart for details).  DDX: Abrasion, cephalhematoma, skull fracture, and cranial hemorrhage, ankle fracture, foot fracture, foot sprain, ankle sprain As part of my medical decision making, I reviewed the following data within the electronic MEDICAL RECORD NUMBER Notes from prior ED visits  ----------------------------------------- 8:38 PM on 12/09/2017 -----------------------------------------  Patient without evidence of acute fracture.  Likely sprain.  Will Ace wrap.  Recommended to keep the ankle elevated as well as use ice and Tylenol.  Patient as well as family are understanding of the diagnosis as well as treatment plan  willing to comply.  We reviewed the images.  Patient says that she has had her last tetanus shot within the last 10 years. ____________________________________________   FINAL CLINICAL IMPRESSION(S) / ED DIAGNOSES  Ankle sprain.  Foot sprain.  Abrasions.    NEW MEDICATIONS STARTED DURING THIS VISIT:  New Prescriptions   No medications on file     Note:  This document was prepared using Dragon voice recognition software and may include unintentional dictation errors.     Myrna Blazer, MD 12/09/17 2039

## 2017-12-09 NOTE — ED Triage Notes (Addendum)
Pt to ED via POV, pt states that she fell around 1100 this morning, pt states that her foot gave out. Pt hit her head and has a knot where she hit it, pt denies LOC. Pt is also c/o left ankle pain. Pt denies being on blood thinners. Swelling noted in the left ankle. Pt is in NAD at this time. Pt has abrasion to both knees, bleeding is controlled at this time.

## 2018-08-31 ENCOUNTER — Emergency Department: Payer: Medicare Other

## 2018-08-31 ENCOUNTER — Other Ambulatory Visit: Payer: Self-pay

## 2018-08-31 ENCOUNTER — Inpatient Hospital Stay
Admission: EM | Admit: 2018-08-31 | Discharge: 2018-09-04 | DRG: 871 | Disposition: A | Discharge status: Home / Self Care | Payer: Medicare Other | Attending: Internal Medicine | Admitting: Internal Medicine

## 2018-08-31 DIAGNOSIS — Z7984 Long term (current) use of oral hypoglycemic drugs: Secondary | ICD-10-CM | POA: Diagnosis not present

## 2018-08-31 DIAGNOSIS — J189 Pneumonia, unspecified organism: Secondary | ICD-10-CM | POA: Diagnosis present

## 2018-08-31 DIAGNOSIS — A419 Sepsis, unspecified organism: Secondary | ICD-10-CM | POA: Diagnosis not present

## 2018-08-31 DIAGNOSIS — J9601 Acute respiratory failure with hypoxia: Secondary | ICD-10-CM | POA: Diagnosis present

## 2018-08-31 DIAGNOSIS — Z87442 Personal history of urinary calculi: Secondary | ICD-10-CM | POA: Diagnosis not present

## 2018-08-31 DIAGNOSIS — E119 Type 2 diabetes mellitus without complications: Secondary | ICD-10-CM | POA: Diagnosis present

## 2018-08-31 DIAGNOSIS — Z882 Allergy status to sulfonamides status: Secondary | ICD-10-CM

## 2018-08-31 DIAGNOSIS — I10 Essential (primary) hypertension: Secondary | ICD-10-CM | POA: Diagnosis present

## 2018-08-31 DIAGNOSIS — Z87891 Personal history of nicotine dependence: Secondary | ICD-10-CM

## 2018-08-31 DIAGNOSIS — R0902 Hypoxemia: Secondary | ICD-10-CM

## 2018-08-31 DIAGNOSIS — Z79899 Other long term (current) drug therapy: Secondary | ICD-10-CM | POA: Diagnosis not present

## 2018-08-31 DIAGNOSIS — Z90710 Acquired absence of both cervix and uterus: Secondary | ICD-10-CM

## 2018-08-31 DIAGNOSIS — N390 Urinary tract infection, site not specified: Secondary | ICD-10-CM | POA: Diagnosis present

## 2018-08-31 DIAGNOSIS — J181 Lobar pneumonia, unspecified organism: Secondary | ICD-10-CM

## 2018-08-31 LAB — CBC WITH DIFFERENTIAL/PLATELET
Abs Immature Granulocytes: 0.06 10*3/uL (ref 0.00–0.07)
Basophils Absolute: 0.1 10*3/uL (ref 0.0–0.1)
Basophils Relative: 0 %
Eosinophils Absolute: 0.1 10*3/uL (ref 0.0–0.5)
Eosinophils Relative: 1 %
HCT: 42 % (ref 36.0–46.0)
HEMOGLOBIN: 13.2 g/dL (ref 12.0–15.0)
Immature Granulocytes: 0 %
LYMPHS ABS: 1.1 10*3/uL (ref 0.7–4.0)
LYMPHS PCT: 8 %
MCH: 32 pg (ref 26.0–34.0)
MCHC: 31.4 g/dL (ref 30.0–36.0)
MCV: 101.9 fL — ABNORMAL HIGH (ref 80.0–100.0)
Monocytes Absolute: 1 10*3/uL (ref 0.1–1.0)
Monocytes Relative: 7 %
Neutro Abs: 11.5 10*3/uL — ABNORMAL HIGH (ref 1.7–7.7)
Neutrophils Relative %: 84 %
Platelets: 218 10*3/uL (ref 150–400)
RBC: 4.12 MIL/uL (ref 3.87–5.11)
RDW: 12.8 % (ref 11.5–15.5)
WBC: 13.9 10*3/uL — ABNORMAL HIGH (ref 4.0–10.5)
nRBC: 0 % (ref 0.0–0.2)

## 2018-08-31 LAB — BLOOD GAS, VENOUS
Patient temperature: 37
pCO2, Ven: 49 mmHg (ref 44.0–60.0)
pH, Ven: 7.36 (ref 7.250–7.430)
pO2, Ven: 40 mmHg (ref 32.0–45.0)

## 2018-08-31 LAB — BRAIN NATRIURETIC PEPTIDE: B NATRIURETIC PEPTIDE 5: 92 pg/mL (ref 0.0–100.0)

## 2018-08-31 LAB — URINALYSIS, ROUTINE W REFLEX MICROSCOPIC
BILIRUBIN URINE: NEGATIVE
Glucose, UA: NEGATIVE mg/dL
Hgb urine dipstick: NEGATIVE
Ketones, ur: NEGATIVE mg/dL
Leukocytes, UA: NEGATIVE
Nitrite: NEGATIVE
Protein, ur: NEGATIVE mg/dL
Specific Gravity, Urine: 1.043 — ABNORMAL HIGH (ref 1.005–1.030)
pH: 5 (ref 5.0–8.0)

## 2018-08-31 LAB — COMPREHENSIVE METABOLIC PANEL
ALT: 10 U/L (ref 0–44)
ANION GAP: 8 (ref 5–15)
AST: 15 U/L (ref 15–41)
Albumin: 3.7 g/dL (ref 3.5–5.0)
Alkaline Phosphatase: 72 U/L (ref 38–126)
BUN: 15 mg/dL (ref 8–23)
CO2: 27 mmol/L (ref 22–32)
CREATININE: 1.04 mg/dL — AB (ref 0.44–1.00)
Calcium: 9.5 mg/dL (ref 8.9–10.3)
Chloride: 105 mmol/L (ref 98–111)
GFR calc Af Amer: 57 mL/min — ABNORMAL LOW (ref >60)
GFR, EST NON AFRICAN AMERICAN: 49 mL/min — AB (ref >60)
Glucose, Bld: 155 mg/dL — ABNORMAL HIGH (ref 70–99)
Potassium: 3.5 mmol/L (ref 3.5–5.1)
Sodium: 140 mmol/L (ref 135–145)
Total Bilirubin: 0.8 mg/dL (ref 0.3–1.2)
Total Protein: 8.2 g/dL — ABNORMAL HIGH (ref 6.5–8.1)

## 2018-08-31 LAB — TROPONIN I: Troponin I: <0.03 ng/mL (ref <0.03)

## 2018-08-31 LAB — GLUCOSE, CAPILLARY
GLUCOSE-CAPILLARY: 124 mg/dL — AB (ref 70–99)
Glucose-Capillary: 101 mg/dL — ABNORMAL HIGH (ref 70–99)
Glucose-Capillary: 125 mg/dL — ABNORMAL HIGH (ref 70–99)

## 2018-08-31 LAB — INFLUENZA PANEL BY PCR (TYPE A & B)
Influenza A By PCR: NEGATIVE
Influenza B By PCR: NEGATIVE

## 2018-08-31 LAB — LACTIC ACID, PLASMA: Lactic Acid, Venous: 1.6 mmol/L (ref 0.5–1.9)

## 2018-08-31 MED ORDER — SODIUM CHLORIDE 0.9 % IV SOLN
500.0000 mg | Freq: Once | INTRAVENOUS | Status: AC
  Administered 2018-08-31: 500 mg via INTRAVENOUS
  Filled 2018-08-31: qty 500

## 2018-08-31 MED ORDER — VITAMIN D3 25 MCG (1000 UNIT) PO TABS
2000.0000 [IU] | ORAL_TABLET | Freq: Every day | ORAL | Status: DC
  Administered 2018-09-01 – 2018-09-04 (×4): 2000 [IU] via ORAL
  Filled 2018-08-31 (×6): qty 2

## 2018-08-31 MED ORDER — ALBUTEROL SULFATE (2.5 MG/3ML) 0.083% IN NEBU: 2.5000 mg | INHALATION_SOLUTION | RESPIRATORY_TRACT | Status: DC | PRN

## 2018-08-31 MED ORDER — IPRATROPIUM-ALBUTEROL 0.5-2.5 (3) MG/3ML IN SOLN
3.0000 mL | Freq: Once | RESPIRATORY_TRACT | Status: AC
  Administered 2018-08-31: 3 mL via RESPIRATORY_TRACT
  Filled 2018-08-31: qty 3

## 2018-08-31 MED ORDER — ENOXAPARIN SODIUM 40 MG/0.4ML ~~LOC~~ SOLN
40.0000 mg | SUBCUTANEOUS | Status: DC
  Administered 2018-08-31 – 2018-09-03 (×4): 40 mg via SUBCUTANEOUS
  Filled 2018-08-31 (×4): qty 0.4

## 2018-08-31 MED ORDER — SODIUM CHLORIDE 0.9 % IV SOLN
Freq: Once | INTRAVENOUS | Status: AC
  Administered 2018-08-31: 17:00:00 via INTRAVENOUS

## 2018-08-31 MED ORDER — ONDANSETRON HCL 4 MG/2ML IJ SOLN
4.0000 mg | Freq: Once | INTRAMUSCULAR | Status: DC
  Filled 2018-08-31: qty 2

## 2018-08-31 MED ORDER — SODIUM CHLORIDE 0.9 % IV BOLUS (SEPSIS)
1500.0000 mL | Freq: Once | INTRAVENOUS | Status: AC
  Administered 2018-08-31: 1500 mL via INTRAVENOUS

## 2018-08-31 MED ORDER — ONDANSETRON HCL 4 MG/2ML IJ SOLN
4.0000 mg | Freq: Four times a day (QID) | INTRAMUSCULAR | Status: DC | PRN
  Administered 2018-08-31: 4 mg via INTRAVENOUS
  Filled 2018-08-31: qty 2

## 2018-08-31 MED ORDER — INSULIN ASPART 100 UNIT/ML ~~LOC~~ SOLN
0.0000 [IU] | Freq: Three times a day (TID) | SUBCUTANEOUS | Status: DC
  Administered 2018-08-31: 1 [IU] via SUBCUTANEOUS
  Administered 2018-09-02: 2 [IU] via SUBCUTANEOUS
  Administered 2018-09-03: 1 [IU] via SUBCUTANEOUS
  Filled 2018-08-31 (×2): qty 1

## 2018-08-31 MED ORDER — SODIUM CHLORIDE 0.9 % IV SOLN
1.0000 g | Freq: Once | INTRAVENOUS | Status: AC
  Administered 2018-08-31: 1 g via INTRAVENOUS
  Filled 2018-08-31: qty 10

## 2018-08-31 MED ORDER — ACETAMINOPHEN 325 MG PO TABS
650.0000 mg | ORAL_TABLET | Freq: Four times a day (QID) | ORAL | Status: DC | PRN
  Administered 2018-09-01 – 2018-09-02 (×3): 650 mg via ORAL
  Filled 2018-08-31 (×4): qty 2

## 2018-08-31 MED ORDER — PANTOPRAZOLE SODIUM 40 MG PO TBEC
40.0000 mg | DELAYED_RELEASE_TABLET | Freq: Every day | ORAL | Status: DC
  Administered 2018-08-31 – 2018-09-03 (×4): 40 mg via ORAL
  Filled 2018-08-31 (×4): qty 1

## 2018-08-31 MED ORDER — AZITHROMYCIN 250 MG PO TABS
250.0000 mg | ORAL_TABLET | Freq: Every day | ORAL | Status: DC
  Administered 2018-09-01 – 2018-09-04 (×4): 250 mg via ORAL
  Filled 2018-08-31 (×4): qty 1

## 2018-08-31 MED ORDER — IOHEXOL 350 MG/ML SOLN
75.0000 mL | Freq: Once | INTRAVENOUS | Status: AC | PRN
  Administered 2018-08-31: 60 mL via INTRAVENOUS

## 2018-08-31 MED ORDER — ACETAMINOPHEN 650 MG RE SUPP: 650.0000 mg | Freq: Four times a day (QID) | RECTAL | Status: DC | PRN

## 2018-08-31 MED ORDER — SODIUM CHLORIDE 0.9 % IV SOLN
1.0000 g | INTRAVENOUS | Status: DC
  Administered 2018-09-01 – 2018-09-04 (×4): 1 g via INTRAVENOUS
  Filled 2018-08-31: qty 1
  Filled 2018-08-31: qty 10
  Filled 2018-08-31 (×2): qty 1

## 2018-08-31 MED ORDER — TRAMADOL HCL 50 MG PO TABS
50.0000 mg | ORAL_TABLET | Freq: Four times a day (QID) | ORAL | Status: DC | PRN
  Administered 2018-08-31: 50 mg via ORAL
  Filled 2018-08-31: qty 1

## 2018-08-31 MED ORDER — ONDANSETRON HCL 4 MG PO TABS: 4.0000 mg | ORAL_TABLET | Freq: Four times a day (QID) | ORAL | Status: DC | PRN

## 2018-08-31 NOTE — ED Triage Notes (Signed)
Right sided chest pain and chest congestion that started today. Nonproductive cough. Pain worse with deep breathing.

## 2018-08-31 NOTE — ED Notes (Signed)
Daugther at bedside. Pt states she was at home feeling  "weak and nauseous" along with chest tightness. VSS. NAD noted at this time.

## 2018-08-31 NOTE — H&P (Signed)
[facility] [place] at [facility]   PATIENT NAME: [name] SachsRachel Avey    MR#:  [id number]  DATE OF BIRTH:  08/28/1931  DATE OF ADMISSION:  08/31/2018  PRIMARY CARE PHYSICIAN: Lauro RegulusAnderson, Marshall W, MD   REQUESTING/REFERRING PHYSICIAN: Dr Sharma CovertNorman  CHIEF COMPLAINT:   Chest pain and cough for 1 1/2 days HISTORY OF PRESENT ILLNESS:  [name] Lucas  is a 85 y.o. female with a known history of diabetes, hypertension comes to the emergency room accompanied by daughter and grandson after she started having chest and congestion for two days. She woke up today had some dry cough started hurting in her back and had difficulty breathing. She was found to have saturations in the upper 80s when she came to the ER along with tachycardia. She was found to have left lower lobe pneumonia. Patient received IV fluids, IV Rocephin and Zithromax.  Patient was found of elevated white count of 13,000 along with tachycardia and positive chest x-ray consistent with sepsis secondary to left lower lobe pneumonia.  PAST MEDICAL HISTORY:   Past Medical History:  Diagnosis Date  . Diabetes mellitus without complication (HCC)   . Hypertension   . Kidney stones     PAST SURGICAL HISTOIRY:   Past Surgical History:  Procedure Laterality Date  . ABDOMINAL HYSTERECTOMY    . ANKLE FRACTURE SURGERY Bilateral   . esophageal dilitation    . kidney stone removal      SOCIAL HISTORY:   Social History   Tobacco Use  . Smoking status: Former Smoker  Substance Use Topics  . Alcohol use: No    FAMILY HISTORY:  No family history on file.  DRUG ALLERGIES:   Allergies  Allergen Reactions  . Sulfa Antibiotics Nausea And Vomiting    REVIEW OF SYSTEMS:  Review of Systems  Constitutional: Negative for chills, fever and weight loss.  HENT: Negative for ear discharge, ear pain and nosebleeds.   Eyes: Negative for blurred vision, pain and discharge.  Respiratory: Positive for cough and  shortness of breath. Negative for sputum production, wheezing and stridor.   Cardiovascular: Positive for chest pain. Negative for palpitations, orthopnea and PND.  Gastrointestinal: Negative for abdominal pain, diarrhea, nausea and vomiting.  Genitourinary: Negative for frequency and urgency.  Musculoskeletal: Negative for back pain and joint pain.  Neurological: Positive for weakness. Negative for sensory change, speech change and focal weakness.  Psychiatric/Behavioral: Negative for depression and hallucinations. The patient is not nervous/anxious.      MEDICATIONS AT HOME:   Prior to Admission medications   Medication Sig Start Date End Date Taking? Authorizing Provider  Cholecalciferol (VITAMIN D3) 50 MCG (2000 UT) capsule Take 2,000 Units by mouth daily.   Yes [provider]  [employer] VITAMIN B12 1000 MCG tablet Take 1,000 mcg by mouth daily. 08/25/18  Yes [provider]  glimepiride (AMARYL) 1 MG tablet Take 1 mg by mouth daily. 07/12/18  Yes [provider]  nitrofurantoin, macrocrystal-monohydrate, (MACROBID) 100 MG capsule Take 1 capsule by mouth 2 (two) times daily. 08/25/18  Yes [provider]  olmesartan-hydrochlorothiazide (BENICAR HCT) 20-12.5 MG tablet Take 1 tablet by mouth daily. 08/25/18 08/25/19 Yes [provider]  omeprazole (PRILOSEC) 20 MG capsule Take 20 mg by mouth 2 (two) times daily. 10/22/14  Yes [provider]      VITAL SIGNS:  Blood pressure (!) 105/56, pulse 84, temperature 98.9 F (37.2 C), temperature source Oral, resp. rate 13, height 5\' 3"  (1.6 m),  weight 69.9 kg, SpO2 98 %.  PHYSICAL EXAMINATION:  GENERAL:  85 y.o.-year-old patient lying in the bed with no acute distress.  EYES: Pupils equal, round, reactive to light and accommodation. No scleral icterus. Extraocular muscles intact.  HEENT: Head atraumatic, normocephalic. Oropharynx and nasopharynx clear.  NECK:  Supple, no jugular venous distention. No  thyroid enlargement, no tenderness.  LUNGS: Normal breath sounds bilaterally, no wheezing,bibasilar rales,no rhonchi or crepitation. No use of accessory muscles of respiration.  CARDIOVASCULAR: S1, S2 normal. No murmurs, rubs, or gallops.  ABDOMEN: Soft, nontender, nondistended. Bowel sounds present. No organomegaly or mass.  EXTREMITIES: No pedal edema, cyanosis, or clubbing.  NEUROLOGIC: Cranial nerves II through XII are intact. Muscle strength 5/5 in all extremities. Sensation intact. Gait not checked.  PSYCHIATRIC: The patient is alert and oriented x 3.  SKIN: No obvious rash, lesion, or ulcer.   LABORATORY PANEL:   CBC Recent Labs  Lab 08/31/18 1010  WBC 13.9*  HGB 13.2  HCT 42.0  PLT 218   ------------------------------------------------------------------------------------------------------------------  Chemistries  Recent Labs  Lab 08/31/18 1010  NA 140  K 3.5  CL 105  CO2 27  GLUCOSE 155*  BUN 15  CREATININE 1.04*  CALCIUM 9.5  AST 15  ALT 10  ALKPHOS 72  BILITOT 0.8   ------------------------------------------------------------------------------------------------------------------  Cardiac Enzymes Recent Labs  Lab 08/31/18 1010  TROPONINI <0.03   ------------------------------------------------------------------------------------------------------------------  RADIOLOGY:  Dg Chest 2 View  Result Date: 08/31/2018 CLINICAL DATA:  Right-sided chest pain EXAM: CHEST - 2 VIEW COMPARISON:  10/02/2008 FINDINGS: Cardiac shadow is stable. Aortic calcifications are seen. Hiatal hernia is noted and stable. The lungs are well aerated bilaterally. Mild left basilar infiltrate is seen. Associated small effusion is noted as well. Old left rib fractures are noted. IMPRESSION: Mild left basilar infiltrate with associated effusion. Stable hiatal hernia. Electronically Signed   By: Alcide CleverMark  Lukens M.D.   On: 08/31/2018 10:17   Ct Angio Chest Pe W And/or Wo Contrast  Result  Date: 08/31/2018 CLINICAL DATA:  Shortness of breath right-sided chest pain EXAM: CT ANGIOGRAPHY CHEST WITH CONTRAST TECHNIQUE: Multidetector CT imaging of the chest was performed using the standard protocol during bolus administration of intravenous contrast. Multiplanar CT image reconstructions and MIPs were obtained to evaluate the vascular anatomy. CONTRAST:  60mL OMNIPAQUE IOHEXOL 350 MG/ML SOLN COMPARISON:  Chest radiograph August 31, 2018 FINDINGS: Cardiovascular: There is no demonstrable pulmonary embolus. There is no thoracic aortic aneurysm or dissection. There is calcification in the proximal visualized great vessels. There is aortic atherosclerosis. There are foci of coronary artery calcification. There is no pericardial effusion or pericardial thickening evident. Mediastinum/Nodes: Visualized thyroid appears unremarkable. There is no appreciable thoracic adenopathy. There is a focal hiatal hernia. Lungs/Pleura: There are small partially loculated pleural effusions in each lung base. There is atelectatic change in each lung base with mild consolidation associated in the lateral and posterior segments of the left lower lobe. Upper Abdomen: There are occasional small calcified liver granulomas. There is upper abdominal aortic atherosclerosis. Visualized upper abdominal aortic structures otherwise appear unremarkable. Musculoskeletal: There is anterior wedging of the T10 vertebral body. No blastic or lytic bone lesions are evident. No chest wall lesions are appreciable. Review of the MIP images confirms the above findings. IMPRESSION: 1. No demonstrable pulmonary embolus. No thoracic aortic aneurysm or dissection. There is aortic atherosclerosis as well as foci of great vessel and coronary artery calcification. 2. Small pleural effusions bilaterally with bibasilar atelectasis. There appears to be a  degree of superimposed pneumonia in portions of the left lower lobe as well. 3.  Moderate hiatal hernia. 4.   No appreciable thoracic adenopathy. 5.  Scattered calcified liver granulomas. 6.  Wedge fracture of the T10 vertebral body. Aortic Atherosclerosis (ICD10-I70.0). Electronically Signed   By: Bretta Bang III M.D.   On: 08/31/2018 11:51    EKG:   Sinus tachycardia IMPRESSION AND PLAN:   Zuha Grieco  is a 85 y.o. female with a known history of diabetes, hypertension comes to the emergency room accompanied by daughter and grandson after she started having chest and congestion for two days.  1. Sepsis secondary left lower lobe pneumonia -patient presented with cough and chest congestion along with chest pain and shortness of breath -positive chest x-ray tachycardia and elevated white count -IV Rocephin and Zithromax. Follow blood culture and CBC  2. Diabetes -sliding scale insulin  3. Leukocytosis due to pneumonia -follow-up CBC  4. DVT prophylaxis subcu Lovenox  Above was discussed with patient patient's daughter    All the records are reviewed and case discussed with ED provider.   CODE STATUS: full  TOTAL TIME TAKING CARE OF THIS PATIENT: **50 minutes.    Enedina Finner M.D on 08/31/2018 at 12:33 PM  Between 7am to 6pm - Pager - [phone]  After 6pm go to [email or web address] - password [email or web address] [facility]  [facility]  Office  [phone]  CC: Primary care physician; Lauro Regulus, MD

## 2018-08-31 NOTE — ED Notes (Signed)
Patient transported to X-ray 

## 2018-08-31 NOTE — ED Provider Notes (Signed)
[facility] Emergency Department Provider Note  ____________________________________________  Time seen: Approximately 9:52 AM  I have reviewed the triage vital signs and the nursing notes.   HISTORY  Chief Complaint Chest Pain and Cough    HPI [name] Lucas is a 85 y.o. female with a history of HTN and DM, without any prior underlying pulmonary or cardiac disease, presenting with cough and shortness of breath, right-sided chest pain.  The patient lives at home with her daughter and states that for the past several nights, she has been feeling short of breath when she lays down.  This morning, the shortness of breath seemed worse and upon arrival to the emergency department she was hypoxic to 89% on room air, with improvement to 94% on 2 L nasal cannula.  She has had a nonproductive cough without any congestion or rhinorrhea, sore throat or ear pain.  She has not been having any fevers or chills.  She has been taking an antibiotic, she does not know which one, for UTI.  She also reports right-sided chest pain which is worse with deep breaths. No LE edema or calf pain.  FH: no hx blood clots  Past Medical History:  Diagnosis Date  . Diabetes mellitus without complication (HCC)   . Hypertension   . Kidney stones     There are no active problems to display for this patient.   Past Surgical History:  Procedure Laterality Date  . ABDOMINAL HYSTERECTOMY    . ANKLE FRACTURE SURGERY Bilateral   . esophageal dilitation    . kidney stone removal      Current Outpatient Rx  . Order #: [id number] Class: Historical Med  . Order #: [id number] Class: Historical Med  . Order #: [id number] Class: Historical Med  . Order #: [id number] Class: Historical Med  . Order #: [id number] Class: Historical Med  . Order #: [id number] Class: Historical Med  . Order #: [id number] Class: Print  . Order #: [id number] Class: Print  . Order #: [id number] Class: Print  . Order #: [id number] Class:  Print    Allergies Sulfa antibiotics  No family history on file.  Social History Social History   Tobacco Use  . Smoking status: Former Smoker  Substance Use Topics  . Alcohol use: No  . Drug use: Not on file    Review of Systems Constitutional: No fever/chills. Eyes: No visual changes. ENT: No sore throat. No congestion or rhinorrhea. Cardiovascular: + chest pain. Denies palpitations. Respiratory: Denies shortness of breath.  + cough. Gastrointestinal: No abdominal pain.  + nausea, no vomiting.  No diarrhea.  No constipation. Genitourinary: Negative for dysuria.  + tx for UTI. Musculoskeletal: Negative for back pain. No LE edema or calf pain. Skin: Negative for rash. Neurological: Negative for headaches. No focal numbness, tingling or weakness.     ____________________________________________   PHYSICAL EXAM:  VITAL SIGNS: ED Triage Vitals [08/31/18 0939]  Enc Vitals Group     BP (!) 129/58     Pulse Rate (!) 108     Resp 20     Temp 98.9 F (37.2 C)     Temp Source Oral     SpO2 (!) 89 %     Weight 154 lb (69.9 kg)     Height 5\' 3"  (1.6 m)     Head Circumference      Peak Flow      Pain Score 6     Pain Loc      Pain Edu?      Excl. in  GC?     Constitutional: Alert and oriented. Answers questions appropriately. Eyes: Conjunctivae are normal.  EOMI. No scleral icterus. Head: Atraumatic. Nose: No congestion/rhinnorhea. Mouth/Throat: Mucous membranes are moist.  Neck: No stridor.  Supple.  No JVD. Cardiovascular: Normal rate, regular rhythm. No murmurs, rubs or gallops.  Respiratory: The patient is not tachypneic and is able to maintain oxygen saturations of 94% on room air.  She does have some end expiratory wheezing without any rales or rhonchi.  She clutches her right chest with deep breaths. Gastrointestinal: Soft, nontender and nondistended.  No guarding or rebound.  No peritoneal signs. Musculoskeletal: No LE edema. No ttp in the calves or  palpable cords.  Negative Homan's sign. Neurologic:  A&Ox3.  Speech is clear.  Face and smile are symmetric.  EOMI.  Moves all extremities well. Skin:  Skin is warm, dry and intact. No rash noted. Psychiatric: Mood and affect are normal. Speech and behavior are normal.  Normal judgement.  ____________________________________________   LABS (all labs ordered are listed, but only abnormal results are displayed)  Labs Reviewed  COMPREHENSIVE METABOLIC PANEL - Abnormal; Notable for the following components:      Result Value   Glucose, Bld 155 (*)    Creatinine, Ser 1.04 (*)    Total Protein 8.2 (*)    GFR calc non Af Amer 49 (*)    GFR calc Af Amer 57 (*)    All other components within normal limits  CBC WITH DIFFERENTIAL/PLATELET - Abnormal; Notable for the following components:   WBC 13.9 (*)    MCV 101.9 (*)    Neutro Abs 11.5 (*)    All other components within normal limits  CULTURE, BLOOD (ROUTINE X 2)  CULTURE, BLOOD (ROUTINE X 2)  LACTIC ACID, PLASMA  TROPONIN I  BLOOD GAS, VENOUS  LACTIC ACID, PLASMA  URINALYSIS, ROUTINE W REFLEX MICROSCOPIC  BRAIN NATRIURETIC PEPTIDE  INFLUENZA PANEL BY PCR (TYPE A & B)   ____________________________________________  EKG  ED ECG REPORT I, Anne-Caroline Sharma CovertNorman, the attending physician, personally viewed and interpreted this ECG.   Date: 08/31/2018  EKG Time: 937  Rate: 109  Rhythm: sinus tachycardia; + LVH  Axis: normal  Intervals:none  ST&T Change: No STEMI  ____________________________________________  RADIOLOGY  Dg Chest 2 View  Result Date: 08/31/2018 CLINICAL DATA:  Right-sided chest pain EXAM: CHEST - 2 VIEW COMPARISON:  10/02/2008 FINDINGS: Cardiac shadow is stable. Aortic calcifications are seen. Hiatal hernia is noted and stable. The lungs are well aerated bilaterally. Mild left basilar infiltrate is seen. Associated small effusion is noted as well. Old left rib fractures are noted. IMPRESSION: Mild left basilar  infiltrate with associated effusion. Stable hiatal hernia. Electronically Signed   By: Alcide CleverMark  Lukens M.D.   On: 08/31/2018 10:17    ____________________________________________   PROCEDURES  Procedure(s) performed: None  Procedures  Critical Care performed: Yes, see critical care note(s) ____________________________________________   INITIAL IMPRESSION / ASSESSMENT AND PLAN / ED COURSE  Pertinent labs & imaging results that were available during my care of the patient were reviewed by me and considered in my medical decision making (see chart for details).  85 y.o. female with a history of DM and HTN presenting for right-sided chest pain, hypoxia, and cough.  Overall, I am concerned about the possibility of an infectious process versus PE.  A CT of the chest has been ordered.  In addition, chest x-ray for quick evaluation of infiltrate has been ordered.  The patient will  receive empiric antibiotics and intravenous fluids.  Influenza testing has also been ordered and I am awaiting the results of a VBG.  The patient has improved on supplemental oxygen but will be closely monitored.  The patient will require admission.  ----------------------------------------- 11:36 AM on 08/31/2018 -----------------------------------------  The patient does have an elevation in her white blood cell count at 13.9 with a left basilar infiltrate and associated effusion.  At this time, we will plan to admit the patient to the hospitalist for continued treatment and evaluation.  CRITICAL CARE Performed by: Rockne Menghini   Total critical care time: 35 minutes  Critical care time was exclusive of separately billable procedures and treating other patients.  Critical care was necessary to treat or prevent imminent or life-threatening deterioration.  Critical care was time spent personally by me on the following activities: development of treatment plan with patient and/or surrogate as well as  nursing, discussions with consultants, evaluation of patient's response to treatment, examination of patient, obtaining history from patient or surrogate, ordering and performing treatments and interventions, ordering and review of laboratory studies, ordering and review of radiographic studies, pulse oximetry and re-evaluation of patient's condition.   ____________________________________________  FINAL CLINICAL IMPRESSION(S) / ED DIAGNOSES  Final diagnoses:  Pneumonia of left lower lobe due to infectious organism (HCC)  Sepsis, due to unspecified organism, unspecified whether acute organ dysfunction present (HCC)  Hypoxia         NEW MEDICATIONS STARTED DURING THIS VISIT:  New Prescriptions   No medications on file      Rockne Menghini, MD 08/31/18 1136

## 2018-08-31 NOTE — Progress Notes (Signed)
CODE SEPSIS - PHARMACY COMMUNICATION  **Broad Spectrum Antibiotics should be administered within 1 hour of Sepsis diagnosis**  Time Code Sepsis Called/Page Received: 9432  Antibiotics Ordered: Azithromycin/Ceftriaxone   Time of 1st antibiotic administration: 1045 Ceftriaxone   Additional action taken by pharmacy: none indicated   If necessary, Name of Provider/Nurse Contacted: N/A    Lataria Courser L ,PharmD Clinical Pharmacist  08/31/2018  11:23 AM

## 2018-08-31 NOTE — Progress Notes (Signed)
Patient received from ED at 12:15 with Bolus just started. of has infused. Completing bolus before maintenance fluids are started.

## 2018-08-31 NOTE — Progress Notes (Signed)
Family Meeting Note  Advance Directive: no  today a meeting took place with the patient and patient's daughter  Patient came in with chest pain cough congestion and shortness of breath found to have left lower lobe pneumonia and sepsis. Received IV antibiotics and fluids. Hemodynamically stable. Discuss code status patient states she wants to be full code however she does not want her life to be prolonged on ventilator if no meaningful recovery. Daughter agrees with it. She is a full code for now.   Time spent during discussion 16 minutes Enedina Finner, MD

## 2018-08-31 NOTE — ED Notes (Signed)
Admitting provider at bedside. Family at bedside.

## 2018-08-31 NOTE — ED Notes (Signed)
Patient back from Xray to room.

## 2018-09-01 LAB — GLUCOSE, CAPILLARY
Glucose-Capillary: 85 mg/dL (ref 70–99)
Glucose-Capillary: 119 mg/dL — ABNORMAL HIGH (ref 70–99)
Glucose-Capillary: 113 mg/dL — ABNORMAL HIGH (ref 70–99)
Glucose-Capillary: 117 mg/dL — ABNORMAL HIGH (ref 70–99)

## 2018-09-01 MED ORDER — SODIUM CHLORIDE 0.9% FLUSH
3.0000 mL | Freq: Two times a day (BID) | INTRAVENOUS | Status: DC
  Administered 2018-09-01 – 2018-09-04 (×7): 3 mL via INTRAVENOUS

## 2018-09-01 NOTE — Progress Notes (Signed)
[employer] [facility] [place] at [facility]   PATIENT NAME: [name] Lucas    MR#:  [id number]  DATE OF BIRTH:  06/08/1932  SUBJECTIVE:  CHIEF COMPLAINT:   Chief Complaint  Patient presents with  . Chest Pain  . Cough   Came with Cough, SOB- noted to have pneumonia and sepsis.  REVIEW OF SYSTEMS:  CONSTITUTIONAL: No fever, fatigue or weakness.  EYES: No blurred or double vision.  EARS, NOSE, AND THROAT: No tinnitus or ear pain.  RESPIRATORY: Have cough, shortness of breath,no wheezing or hemoptysis.  CARDIOVASCULAR: No chest pain, orthopnea, edema.  GASTROINTESTINAL: No nausea, vomiting, diarrhea or abdominal pain.  GENITOURINARY: No dysuria, hematuria.  ENDOCRINE: No polyuria, nocturia,  HEMATOLOGY: No anemia, easy bruising or bleeding SKIN: No rash or lesion. MUSCULOSKELETAL: No joint pain or arthritis.   NEUROLOGIC: No tingling, numbness, weakness.  PSYCHIATRY: No anxiety or depression.   ROS  DRUG ALLERGIES:   Allergies  Allergen Reactions  . Sulfa Antibiotics Nausea And Vomiting    VITALS:  Blood pressure (!) 157/52, pulse 70, temperature 98.7 F (37.1 C), temperature source Oral, resp. rate 16, height 5\' 3"  (1.6 m), weight 69.2 kg, SpO2 94 %.  PHYSICAL EXAMINATION:  GENERAL:  85 y.o.-year-old patient lying in the bed with no acute distress.  EYES: Pupils equal, round, reactive to light and accommodation. No scleral icterus. Extraocular muscles intact.  HEENT: Head atraumatic, normocephalic. Oropharynx and nasopharynx clear.  NECK:  Supple, no jugular venous distention. No thyroid enlargement, no tenderness.  LUNGS: Normal breath sounds bilaterally, no wheezing, rales,have crepitation. No use of accessory muscles of respiration.  CARDIOVASCULAR: S1, S2 normal. No murmurs, rubs, or gallops.  ABDOMEN: Soft, nontender, nondistended. Bowel sounds present. No organomegaly or mass.  EXTREMITIES: No pedal edema, cyanosis, or clubbing.  NEUROLOGIC: Cranial  nerves II through XII are intact. Muscle strength 4/5 in all extremities. Sensation intact. Gait not checked.  PSYCHIATRIC: The patient is alert and oriented x 3.  SKIN: No obvious rash, lesion, or ulcer.   Physical Exam LABORATORY PANEL:   CBC Recent Labs  Lab 08/31/18 1010  WBC 13.9*  HGB 13.2  HCT 42.0  PLT 218   ------------------------------------------------------------------------------------------------------------------  Chemistries  Recent Labs  Lab 08/31/18 1010  NA 140  K 3.5  CL 105  CO2 27  GLUCOSE 155*  BUN 15  CREATININE 1.04*  CALCIUM 9.5  AST 15  ALT 10  ALKPHOS 72  BILITOT 0.8   ------------------------------------------------------------------------------------------------------------------  Cardiac Enzymes Recent Labs  Lab 08/31/18 1010  TROPONINI <0.03   ------------------------------------------------------------------------------------------------------------------  RADIOLOGY:  Dg Chest 2 View  Result Date: 08/31/2018 CLINICAL DATA:  Right-sided chest pain EXAM: CHEST - 2 VIEW COMPARISON:  10/02/2008 FINDINGS: Cardiac shadow is stable. Aortic calcifications are seen. Hiatal hernia is noted and stable. The lungs are well aerated bilaterally. Mild left basilar infiltrate is seen. Associated small effusion is noted as well. Old left rib fractures are noted. IMPRESSION: Mild left basilar infiltrate with associated effusion. Stable hiatal hernia. Electronically Signed   By: Alcide Clever M.D.   On: 08/31/2018 10:17   Ct Angio Chest Pe W And/or Wo Contrast  Result Date: 08/31/2018 CLINICAL DATA:  Shortness of breath right-sided chest pain EXAM: CT ANGIOGRAPHY CHEST WITH CONTRAST TECHNIQUE: Multidetector CT imaging of the chest was performed using the standard protocol during bolus administration of intravenous contrast. Multiplanar CT image reconstructions and MIPs were obtained to evaluate the vascular anatomy. CONTRAST:  46mL OMNIPAQUE IOHEXOL  350 MG/ML SOLN  COMPARISON:  Chest radiograph August 31, 2018 FINDINGS: Cardiovascular: There is no demonstrable pulmonary embolus. There is no thoracic aortic aneurysm or dissection. There is calcification in the proximal visualized great vessels. There is aortic atherosclerosis. There are foci of coronary artery calcification. There is no pericardial effusion or pericardial thickening evident. Mediastinum/Nodes: Visualized thyroid appears unremarkable. There is no appreciable thoracic adenopathy. There is a focal hiatal hernia. Lungs/Pleura: There are small partially loculated pleural effusions in each lung base. There is atelectatic change in each lung base with mild consolidation associated in the lateral and posterior segments of the left lower lobe. Upper Abdomen: There are occasional small calcified liver granulomas. There is upper abdominal aortic atherosclerosis. Visualized upper abdominal aortic structures otherwise appear unremarkable. Musculoskeletal: There is anterior wedging of the T10 vertebral body. No blastic or lytic bone lesions are evident. No chest wall lesions are appreciable. Review of the MIP images confirms the above findings. IMPRESSION: 1. No demonstrable pulmonary embolus. No thoracic aortic aneurysm or dissection. There is aortic atherosclerosis as well as foci of great vessel and coronary artery calcification. 2. Small pleural effusions bilaterally with bibasilar atelectasis. There appears to be a degree of superimposed pneumonia in portions of the left lower lobe as well. 3.  Moderate hiatal hernia. 4.  No appreciable thoracic adenopathy. 5.  Scattered calcified liver granulomas. 6.  Wedge fracture of the T10 vertebral body. Aortic Atherosclerosis (ICD10-I70.0). Electronically Signed   By: Bretta Bang III M.D.   On: 08/31/2018 11:51    ASSESSMENT AND PLAN:   Active Problems:   Sepsis (HCC)  [name] Lucas  is a 85 y.o. female with a known history of diabetes, hypertension  comes to the emergency room accompanied by daughter and grandson after she started having chest and congestion for two days.  1. Sepsis secondary left lower lobe pneumonia -patient presented with cough and chest congestion along with chest pain and shortness of breath -positive chest x-ray tachycardia and elevated white count -IV Rocephin and Zithromax. Follow blood culture and CBC  2. Diabetes -sliding scale insulin  3. Leukocytosis due to pneumonia -follow-up CBC  4. DVT prophylaxis subcu Lovenox   All the records are reviewed and case discussed with Care Management/Social Workerr. Management plans discussed with the patient, family and they are in agreement.  CODE STATUS: Full.  TOTAL TIME TAKING CARE OF THIS PATIENT: 35 minutes.   POSSIBLE D/C IN 1-2 DAYS, DEPENDING ON CLINICAL CONDITION.   Altamese Dilling M.D on 09/01/2018   Between 7am to 6pm - Pager - [phone]  After 6pm go to [email or web address] - Social research officer, government  [facility]  Office  [phone]  CC: Primary care physician; Lauro Regulus, MD  Note: This dictation was prepared with Dragon dictation along with smaller phrase technology. Any transcriptional errors that result from this process are unintentional.

## 2018-09-02 LAB — GLUCOSE, CAPILLARY
Glucose-Capillary: 92 mg/dL (ref 70–99)
Glucose-Capillary: 165 mg/dL — ABNORMAL HIGH (ref 70–99)
Glucose-Capillary: 83 mg/dL (ref 70–99)
Glucose-Capillary: 125 mg/dL — ABNORMAL HIGH (ref 70–99)

## 2018-09-02 LAB — CREATININE, SERUM
Creatinine, Ser: 0.93 mg/dL (ref 0.44–1.00)
GFR calc non Af Amer: 56 mL/min — ABNORMAL LOW (ref >60)

## 2018-09-02 MED ORDER — SENNOSIDES-DOCUSATE SODIUM 8.6-50 MG PO TABS
1.0000 | ORAL_TABLET | Freq: Two times a day (BID) | ORAL | Status: DC
  Administered 2018-09-02 – 2018-09-04 (×4): 1 via ORAL
  Filled 2018-09-02 (×4): qty 1

## 2018-09-02 NOTE — Plan of Care (Signed)
  Problem: Activity: Goal: Risk for activity intolerance will decrease Outcome: Progressing   Problem: Safety: Goal: Ability to remain free from injury will improve Outcome: Progressing   Problem: Skin Integrity: Goal: Risk for impaired skin integrity will decrease Outcome: Progressing   Problem: Clinical Measurements: Goal: Diagnostic test results will improve Outcome: Progressing Goal: Signs and symptoms of infection will decrease Outcome: Progressing

## 2018-09-02 NOTE — Progress Notes (Signed)
[employer] - [place] at [facility]   PATIENT NAME: [name] Lucas    MR#:  [id number]  DATE OF BIRTH:  [birth year]  SUBJECTIVE:  CHIEF COMPLAINT:   Chief Complaint  Patient presents with  . Chest Pain  . Cough   Came with Cough, SOB- noted to have pneumonia and sepsis. Oxygen saturation is getting better but patient still have generalized weakness.  REVIEW OF SYSTEMS:  CONSTITUTIONAL: No fever, fatigue or weakness.  EYES: No blurred or double vision.  EARS, NOSE, AND THROAT: No tinnitus or ear pain.  RESPIRATORY: Have cough, shortness of breath,no wheezing or hemoptysis.  CARDIOVASCULAR: No chest pain, orthopnea, edema.  GASTROINTESTINAL: No nausea, vomiting, diarrhea or abdominal pain.  GENITOURINARY: No dysuria, hematuria.  ENDOCRINE: No polyuria, nocturia,  HEMATOLOGY: No anemia, easy bruising or bleeding SKIN: No rash or lesion. MUSCULOSKELETAL: No joint pain or arthritis.   NEUROLOGIC: No tingling, numbness, weakness.  PSYCHIATRY: No anxiety or depression.   ROS  DRUG ALLERGIES:   Allergies  Allergen Reactions  . Sulfa Antibiotics Nausea And Vomiting    VITALS:  Blood pressure (!) 135/40, pulse (!) 48, temperature 97.9 F (36.6 C), temperature source Oral, resp. rate 20, height 5\' 3"  (1.6 m), weight 69.2 kg, SpO2 91 %.  PHYSICAL EXAMINATION:  GENERAL:  85 y.o.-year-old patient lying in the bed with no acute distress.  EYES: Pupils equal, round, reactive to light and accommodation. No scleral icterus. Extraocular muscles intact.  HEENT: Head atraumatic, normocephalic. Oropharynx and nasopharynx clear.  NECK:  Supple, no jugular venous distention. No thyroid enlargement, no tenderness.  LUNGS: Normal breath sounds bilaterally, no wheezing, rales,have crepitation. No use of accessory muscles of respiration.  CARDIOVASCULAR: S1, S2 normal. No murmurs, rubs, or gallops.  ABDOMEN: Soft, nontender, nondistended. Bowel sounds present. No organomegaly or  mass.  EXTREMITIES: No pedal edema, cyanosis, or clubbing.  NEUROLOGIC: Cranial nerves II through XII are intact. Muscle strength 4/5 in all extremities. Sensation intact. Gait not checked.  PSYCHIATRIC: The patient is alert and oriented x 3.  SKIN: No obvious rash, lesion, or ulcer.   Physical Exam LABORATORY PANEL:   CBC Recent Labs  Lab 08/31/18 1010  WBC 13.9*  HGB 13.2  HCT 42.0  PLT 218   ------------------------------------------------------------------------------------------------------------------  Chemistries  Recent Labs  Lab 08/31/18 1010 09/02/18 0343  NA 140  --   K 3.5  --   CL 105  --   CO2 27  --   GLUCOSE 155*  --   BUN 15  --   CREATININE 1.04* 0.93  CALCIUM 9.5  --   AST 15  --   ALT 10  --   ALKPHOS 72  --   BILITOT 0.8  --    ------------------------------------------------------------------------------------------------------------------  Cardiac Enzymes Recent Labs  Lab 08/31/18 1010  TROPONINI <0.03   ------------------------------------------------------------------------------------------------------------------  RADIOLOGY:  No results found.  ASSESSMENT AND PLAN:   Active Problems:   Sepsis (HCC)  [name] Lucas  is a 85 y.o. female with a known history of diabetes, hypertension comes to the emergency room accompanied by daughter and grandson after she started having chest and congestion for two days.  1. Sepsis secondary left lower lobe pneumonia, acute respiratory failure with hypoxia -patient presented with cough and chest congestion along with chest pain and shortness of breath -positive chest x-ray tachycardia and elevated white count -IV Rocephin and Zithromax. Follow blood culture and CBC -Also advised to use incentive spirometer, overall improving. -Advised the nurse to taper  the oxygen off and check saturations on room air.  2. Diabetes -sliding scale insulin  3. Leukocytosis due to pneumonia -follow-up  CBC  4. DVT prophylaxis subcu Lovenox  5.  Generalized weakness As per patient and her daughter she walks holding onto a cane and leaning onto the furniture is at home.  She has more weakness due to her sickness currently. We will get physical therapy evaluation.  All the records are reviewed and case discussed with Care Management/Social Workerr. Management plans discussed with the patient, family and they are in agreement.  CODE STATUS: Full.  TOTAL TIME TAKING CARE OF THIS PATIENT: 35 minutes.   POSSIBLE D/C IN 1-2 DAYS, DEPENDING ON CLINICAL CONDITION.   Altamese Dilling M.D on 09/02/2018   Between 7am to 6pm - Pager - [phone]  After 6pm go to [email or web address] - Social research officer, government  [facility]  Office  [phone]  CC: Primary care physician; Lauro Regulus, MD  Note: This dictation was prepared with Dragon dictation along with smaller phrase technology. Any transcriptional errors that result from this process are unintentional.

## 2018-09-03 LAB — GLUCOSE, CAPILLARY
Glucose-Capillary: 119 mg/dL — ABNORMAL HIGH (ref 70–99)
Glucose-Capillary: 134 mg/dL — ABNORMAL HIGH (ref 70–99)
Glucose-Capillary: 103 mg/dL — ABNORMAL HIGH (ref 70–99)
Glucose-Capillary: 90 mg/dL (ref 70–99)

## 2018-09-03 NOTE — Progress Notes (Signed)
[employer] - [place] at [facility]   PATIENT NAME: [name] Lucas    MR#:  [id number]  DATE OF BIRTH:  11/03/1931  SUBJECTIVE:  CHIEF COMPLAINT:   Chief Complaint  Patient presents with  . Chest Pain  . Cough   Came with Cough, SOB- noted to have pneumonia and sepsis. Oxygen saturation is getting better but patient still have generalized weakness.  The nurse could not wean her off and has to keep her on 1 L oxygen supplementation.  REVIEW OF SYSTEMS:  CONSTITUTIONAL: No fever, fatigue or weakness.  EYES: No blurred or double vision.  EARS, NOSE, AND THROAT: No tinnitus or ear pain.  RESPIRATORY: Have cough, shortness of breath,no wheezing or hemoptysis.  CARDIOVASCULAR: No chest pain, orthopnea, edema.  GASTROINTESTINAL: No nausea, vomiting, diarrhea or abdominal pain.  GENITOURINARY: No dysuria, hematuria.  ENDOCRINE: No polyuria, nocturia,  HEMATOLOGY: No anemia, easy bruising or bleeding SKIN: No rash or lesion. MUSCULOSKELETAL: No joint pain or arthritis.   NEUROLOGIC: No tingling, numbness, weakness.  PSYCHIATRY: No anxiety or depression.   ROS  DRUG ALLERGIES:   Allergies  Allergen Reactions  . Sulfa Antibiotics Nausea And Vomiting    VITALS:  Blood pressure (!) 116/51, pulse 64, temperature 98 F (36.7 C), resp. rate 20, height 5\' 3"  (1.6 m), weight 69.2 kg, SpO2 92 %.  PHYSICAL EXAMINATION:  GENERAL:  85 y.o.-year-old patient lying in the bed with no acute distress.  EYES: Pupils equal, round, reactive to light and accommodation. No scleral icterus. Extraocular muscles intact.  HEENT: Head atraumatic, normocephalic. Oropharynx and nasopharynx clear.  NECK:  Supple, no jugular venous distention. No thyroid enlargement, no tenderness.  LUNGS: Normal breath sounds bilaterally, no wheezing, rales,have crepitation. No use of accessory muscles of respiration.  CARDIOVASCULAR: S1, S2 normal. No murmurs, rubs, or gallops.  ABDOMEN: Soft, nontender,  nondistended. Bowel sounds present. No organomegaly or mass.  EXTREMITIES: No pedal edema, cyanosis, or clubbing.  NEUROLOGIC: Cranial nerves II through XII are intact. Muscle strength 4/5 in all extremities. Sensation intact. Gait not checked.  PSYCHIATRIC: The patient is alert and oriented x 3.  SKIN: No obvious rash, lesion, or ulcer.   Physical Exam LABORATORY PANEL:   CBC Recent Labs  Lab 08/31/18 1010  WBC 13.9*  HGB 13.2  HCT 42.0  PLT 218   ------------------------------------------------------------------------------------------------------------------  Chemistries  Recent Labs  Lab 08/31/18 1010 09/02/18 0343  NA 140  --   K 3.5  --   CL 105  --   CO2 27  --   GLUCOSE 155*  --   BUN 15  --   CREATININE 1.04* 0.93  CALCIUM 9.5  --   AST 15  --   ALT 10  --   ALKPHOS 72  --   BILITOT 0.8  --    ------------------------------------------------------------------------------------------------------------------  Cardiac Enzymes Recent Labs  Lab 08/31/18 1010  TROPONINI <0.03   ------------------------------------------------------------------------------------------------------------------  RADIOLOGY:  No results found.  ASSESSMENT AND PLAN:   Active Problems:   Sepsis (HCC)  [name] Lucas  is a 85 y.o. female with a known history of diabetes, hypertension comes to the emergency room accompanied by daughter and grandson after she started having chest and congestion for two days.  1. Sepsis secondary left lower lobe pneumonia, acute respiratory failure with hypoxia -patient presented with cough and chest congestion along with chest pain and shortness of breath -positive chest x-ray tachycardia and elevated white count -IV Rocephin and Zithromax. Follow blood culture and  CBC-cultures are negative so far. -Also advised to use incentive spirometer, overall improving. -Advised the nurse to taper the oxygen off and check saturations on room air. -She  still required 1 L oxygen today and had significant drop with physical therapy.  2. Diabetes -sliding scale insulin  3. Leukocytosis due to pneumonia -follow-up CBC  4. DVT prophylaxis subcu Lovenox  5.  Generalized weakness As per patient and her daughter she walks holding onto a cane and leaning onto the furniture is at home.  She has more weakness due to her sickness currently. We will get physical therapy evaluation.  They have suggested supervised.  Observation and home health physical therapy.  All the records are reviewed and case discussed with Care Management/Social Workerr. Management plans discussed with the patient, family and they are in agreement.  CODE STATUS: Full.  TOTAL TIME TAKING CARE OF THIS PATIENT: 35 minutes.   POSSIBLE D/C IN 1-2 DAYS, DEPENDING ON CLINICAL CONDITION. Waiting on weaning her off to room air for discharge.  Altamese DillingVaibhavkumar Evalynn Hankins M.D on 09/03/2018   Between 7am to 6pm - Pager - [phone]  After 6pm go to [email or web address] - Social research officer, governmentpassword EPAS [facility]  [facility] [place] [facility]  Office  [phone]  CC: Primary care physician; Lauro RegulusAnderson, Marshall W, MD  Note: This dictation was prepared with Dragon dictation along with smaller phrase technology. Any transcriptional errors that result from this process are unintentional.

## 2018-09-03 NOTE — Plan of Care (Signed)
Oxygen 92 at [facility].  89% on room air.  Dyspnea on exertion continues.

## 2018-09-03 NOTE — Evaluation (Signed)
Physical Therapy Evaluation Patient Details Name: [name] Lucas MRN: [id number] DOB: 1932/05/03 Today's Date: 09/03/2018   History of Present Illness  Pt is a 85 y/o F who presented with chest pain, congestion, cough.  Pt was found to have dec saturations with tachycardia.  Pt found to have left lower lobe pneumonia and admitted for sepsis.  CT chest negative for PE.     Clinical Impression  Pt admitted with above diagnosis. Pt currently with functional limitations due to the deficits listed below (see PT Problem List). [name] Lucas put forth good effort with PT but activity limited due to SpO2 dropping.  See general comments below for specific SpO2 readings.  Pt required several standing rest breaks due to SpO2 when ambulating and required cues for safe RW management.  Recommending HHPT and supervision/mobility for OOB activity due to pt's unsteadiness and poor safety awareness when ambulating with RW. Pt will benefit from skilled PT to increase their independence and safety with mobility to allow discharge to the venue listed below.      Follow Up Recommendations Supervision for mobility/OOB;Home health PT    Equipment Recommendations  None recommended by PT    Recommendations for Other Services       Precautions / Restrictions Precautions Precautions: Fall;Other (comment) Precaution Comments: monitor O2 Restrictions Weight Bearing Restrictions: No      Mobility  Bed Mobility Overal bed mobility: Needs Assistance Bed Mobility: Supine to Sit     Supine to sit: Supervision;HOB elevated     General bed mobility comments: Supervision for safety.  Pt uses bed rail with increased time and effort.   Transfers Overall transfer level: Needs assistance Equipment used: Rolling walker (2 wheeled) Transfers: Sit to/from Stand Sit to Stand: Supervision         General transfer comment: Cues for proper hand placement when using RW.  Supervision for safety.    Ambulation/Gait Ambulation/Gait assistance: Min assist Gait Distance (Feet): 60 Feet Assistive device: Rolling walker (2 wheeled) Gait Pattern/deviations: Decreased step length - right;Decreased step length - left;Trunk flexed Gait velocity: decreased   General Gait Details: Cues for proper RW management as pt pushes it too far ahead.  Unsteady gait, occasionally requiring min assist to remain steady. However, balance improves to min guard with increased ambualtory distance.  See general comments below for SpO2 readings.   Stairs            Wheelchair Mobility    Modified Rankin (Stroke Patients Only)       Balance Overall balance assessment: Needs assistance Sitting-balance support: No upper extremity supported;Feet supported Sitting balance-Leahy Scale: Good     Standing balance support: Single extremity supported;During functional activity Standing balance-Leahy Scale: Poor Standing balance comment: Pt relies on at least 1UE support for static and dynamic activity                             Pertinent Vitals/Pain Pain Assessment: No/denies pain    Home Living Family/patient expects to be discharged to:: Private residence Living Arrangements: Children;Other relatives(daughter and grandson) Available Help at Discharge: Family;Available PRN/intermittently Type of Home: House Home Access: Stairs to enter Entrance Stairs-Rails: None Entrance Stairs-Number of Steps: 1 Home Layout: One level Home Equipment: Shower seat;Hand held shower head;Cane - single point;Walker - 2 wheels;Walker - 4 wheels;Wheelchair - manual Additional Comments: Daughter works 3rd shift, grandson works during the day.  Pt is alone for a few hours in the morning  and a few hours in the afternoon.     Prior Function Level of Independence: Needs assistance   Gait / Transfers Assistance Needed: No falls in the past 6 months.  Pt ambulates household distances typically with SPC and  furniture walks.   ADL's / Homemaking Assistance Needed: Daughter does the cooking.  Pt helps some with washing dishes or folding clothes but daughter does remaining cleaning. Assist from daughter for bathing, pt becomes SOB easily.  Pt dresses ind.         Hand Dominance        Extremity/Trunk Assessment   Upper Extremity Assessment Upper Extremity Assessment: Generalized weakness    Lower Extremity Assessment Lower Extremity Assessment: Generalized weakness       Communication   Communication: No difficulties  Cognition Arousal/Alertness: Awake/alert Behavior During Therapy: WFL for tasks assessed/performed Overall Cognitive Status: Within Functional Limits for tasks assessed                                        General Comments General comments (skin integrity, edema, etc.): SpO2 92% on 1L O2 in supine and with bed mobility.  SpO2 93% sitting EOB on RA. SpO2 down to 88% on RA following sit>stand.  SpO2 improved to mid to high 90s with 1L O2 and ~30 seconds.  SpO2 dropped as low as 86% on 1L O2 while ambulating, requiring several standing rest breaks for SpO2 to improve to mid to high 90s.  SpO2 97% on 1L O2 at end of session sitting in chair.     Exercises Other Exercises Other Exercises: Demonstration and cues for pursed lip breathing which causes pt to cough.  Instructed pt in upright posture with forward gaze and taking deep breaths instead.    Assessment/Plan    PT Assessment Patient needs continued PT services  PT Problem List Decreased strength;Decreased activity tolerance;Decreased balance;Decreased knowledge of use of DME;Decreased safety awareness;Cardiopulmonary status limiting activity       PT Treatment Interventions DME instruction;Gait training;Stair training;Functional mobility training;Therapeutic activities;Therapeutic exercise;Balance training;Neuromuscular re-education;Patient/family education    PT Goals (Current goals can be  found in the Care Plan section)  Acute Rehab PT Goals Patient Stated Goal: to go home PT Goal Formulation: With patient Time For Goal Achievement: 09/17/18 Potential to Achieve Goals: Good    Frequency Min 2X/week   Barriers to discharge Decreased caregiver support pt alone for several hours each day    Co-evaluation               AM-PAC PT "6 Clicks" Mobility  Outcome Measure Help needed turning from your back to your side while in a flat bed without using bedrails?: A Little Help needed moving from lying on your back to sitting on the side of a flat bed without using bedrails?: A Little Help needed moving to and from a bed to a chair (including a wheelchair)?: A Little Help needed standing up from a chair using your arms (e.g., wheelchair or bedside chair)?: A Little Help needed to walk in hospital room?: A Little Help needed climbing 3-5 steps with a railing? : A Little 6 Click Score: 18    End of Session Equipment Utilized During Treatment: Gait belt;Oxygen Activity Tolerance: Patient limited by fatigue;Treatment limited secondary to medical complications (Comment)(hypoxia) Patient left: in chair;with call bell/phone within reach;with family/visitor present;Other (comment)(no chair alarm box in rom) Nurse Communication: Mobility status;Other (  comment)(SpO2; no chair alarm box in room) PT Visit Diagnosis: Unsteadiness on feet (R26.81);Muscle weakness (generalized) (M62.81)    Time: 1610-9604 PT Time Calculation (min) (ACUTE ONLY): 42 min   Charges:   PT Evaluation $PT Eval Low Complexity: 1 Low PT Treatments $Gait Training: 8-22 mins $Therapeutic Activity: 8-22 mins        Encarnacion Chu PT, DPT 09/03/2018, 1:03 PM

## 2018-09-04 LAB — GLUCOSE, CAPILLARY: Glucose-Capillary: 108 mg/dL — ABNORMAL HIGH (ref 70–99)

## 2018-09-04 MED ORDER — AZITHROMYCIN 250 MG PO TABS: 250.0000 mg | ORAL_TABLET | Freq: Every day | ORAL | 0 refills | Status: DC

## 2018-09-04 MED ORDER — ALBUTEROL SULFATE HFA 108 (90 BASE) MCG/ACT IN AERS: 2.0000 | INHALATION_SPRAY | Freq: Four times a day (QID) | RESPIRATORY_TRACT | 0 refills | Status: AC | PRN

## 2018-09-04 NOTE — Care Management Important Message (Signed)
Copy of signed Medicare IM left with patient in room. 

## 2018-09-04 NOTE — Care Management Note (Addendum)
Case Management Note  Patient Details  Name: [name] Lucas MRN: [id number] Date of Birth: [birth year]  Subjective/Objective:      Patient admitted with sepsis/PNA.  Discharging today.              Action/Plan:  She is from home with daughter.  Current with PCP, Dr. Durenda Age.  Obtains medications at [facility].  Currently on room air. Denies difficulties obtaining medications or with accessing medical care.  She has a walker, cane and wheelchair.  She is asking for a BSC.  Referral made to [facility] for [facility] and [facility] RN, PT.  Daughter will transport home today.  No further needs identified at this time.      Expected Discharge Date:  09/04/18               Expected Discharge Plan:  Home w Home Health Services  In-House Referral:     Discharge planning Services  CM Consult  Post Acute Care Choice:    Choice offered to:  Patient  DME Arranged:  Bedside commode DME Agency:  [facility]  HH Arranged:  RN, PT [facility] Agency:  [facility]  Status of Service:  Completed, signed off  If discussed at Long Length of Stay Meetings, dates discussed:    Additional Comments:  Sherren Kerns, RN 09/04/2018, 10:36 AM

## 2018-09-04 NOTE — Progress Notes (Signed)
Patient ambulated out to nurses station, O2 sats remained above 90% with exertion on room air. Daughter at bedside. IVs and tele removed from patient. Discharge instructions given to patient. Verbalized understanding. No acute distress at this time. Daughter to transport patient home.

## 2018-09-04 NOTE — Discharge Summary (Signed)
[employer] -  at [facility]   PATIENT NAME: [name] Lucas    MR#:  [id number]  DATE OF BIRTH:  05-18-1932  DATE OF ADMISSION:  08/31/2018   ADMITTING PHYSICIAN: Enedina Finner, MD  DATE OF DISCHARGE: 09/04/2018 12:00 PM  PRIMARY CARE PHYSICIAN: Lauro Regulus, MD   ADMISSION DIAGNOSIS:  Hypoxia [R09.02] Pneumonia of left lower lobe due to infectious organism (HCC) [J18.1] Sepsis, due to unspecified organism, unspecified whether acute organ dysfunction present (HCC) [A41.9] DISCHARGE DIAGNOSIS:  Active Problems:   Sepsis (HCC)  SECONDARY DIAGNOSIS:   Past Medical History:  Diagnosis Date  . Diabetes mellitus without complication (HCC)   . Hypertension   . Kidney stones    HOSPITAL COURSE:  [name] Lucas a86 y.o.femalewith a known history of diabetes, hypertension comes to the emergency room accompanied by daughter and grandson after she started having chest and congestion for two days.  1.Sepsis secondary left lower lobe pneumonia, acute respiratory failure with hypoxia -patient presented with cough and chest congestion along with chest pain and shortness of breath -positive chest x-ray tachycardia and elevated white count -IV Rocephin and Zithromax. Follow blood culture and CBC-cultures are negative so far.  Change to p.o. Zithromax for 2 more days. -Also advised to use incentive spirometer, overall improving. The patient is off oxygen without hypoxia.  2.Diabetes -sliding scale insulin.  Resume glimepiride after discharge.  3.Leukocytosis due to pneumonia  4.DVT prophylaxis subcu Lovenox  5.  Generalized weakness As per patient and her daughter she walks holding onto a cane and leaning onto the furniture is at home.  She has more weakness due to her sickness currently. PT: home health physical therapy. DISCHARGE CONDITIONS:  Stable, discharge to home with home health and PT today. CONSULTS OBTAINED:   DRUG ALLERGIES:    Allergies  Allergen Reactions  . Sulfa Antibiotics Nausea And Vomiting   DISCHARGE MEDICATIONS:   Allergies as of 09/04/2018      Reactions   Sulfa Antibiotics Nausea And Vomiting      Medication List    STOP taking these medications   nitrofurantoin (macrocrystal-monohydrate) 100 MG capsule Commonly known as:  MACROBID   olmesartan-hydrochlorothiazide 20-12.5 MG tablet Commonly known as:  BENICAR HCT     TAKE these medications   albuterol 108 (90 Base) MCG/ACT inhaler Commonly known as:  PROVENTIL HFA;VENTOLIN HFA Inhale 2 puffs into the lungs every 6 (six) hours as needed for wheezing or shortness of breath.   azithromycin 250 MG tablet Commonly known as:  ZITHROMAX Take 1 tablet (250 mg total) by mouth daily.   [facility] VITAMIN B12 1000 MCG tablet Generic drug:  cyanocobalamin Take 1,000 mcg by mouth daily.   glimepiride 1 MG tablet Commonly known as:  AMARYL Take 1 mg by mouth daily.   omeprazole 20 MG capsule Commonly known as:  PRILOSEC Take 20 mg by mouth 2 (two) times daily.   Vitamin D3 50 MCG (2000 UT) capsule Take 2,000 Units by mouth daily.        DISCHARGE INSTRUCTIONS:  See AVS.  If you experience worsening of your admission symptoms, develop shortness of breath, life threatening emergency, suicidal or homicidal thoughts you must seek medical attention immediately by calling 911 or calling your MD immediately  if symptoms less severe.  You Must read complete instructions/literature along with all the possible adverse reactions/side effects for all the Medicines you take and that have been prescribed to you. Take any new Medicines after you have completely understood  and accpet all the possible adverse reactions/side effects.   Please note  You were cared for by a hospitalist during your hospital stay. If you have any questions about your discharge medications or the care you received while you were in the hospital after you are discharged, you can  call the unit and asked to speak with the hospitalist on call if the hospitalist that took care of you is not available. Once you are discharged, your primary care physician will handle any further medical issues. Please note that NO REFILLS for any discharge medications will be authorized once you are discharged, as it is imperative that you return to your primary care physician (or establish a relationship with a primary care physician if you do not have one) for your aftercare needs so that they can reassess your need for medications and monitor your lab values.    On the day of Discharge:  VITAL SIGNS:  Blood pressure (!) 113/52, pulse 83, temperature 98 F (36.7 C), resp. rate 16, height 5\' 3"  (1.6 m), weight 69.2 kg, SpO2 93 %. PHYSICAL EXAMINATION:  GENERAL:  85 y.o.-year-old patient lying in the bed with no acute distress.  EYES: Pupils equal, round, reactive to light and accommodation. No scleral icterus. Extraocular muscles intact.  HEENT: Head atraumatic, normocephalic. Oropharynx and nasopharynx clear.  NECK:  Supple, no jugular venous distention. No thyroid enlargement, no tenderness.  LUNGS: Normal breath sounds bilaterally, no wheezing, rales,rhonchi or crepitation. No use of accessory muscles of respiration.  CARDIOVASCULAR: S1, S2 normal. No murmurs, rubs, or gallops.  ABDOMEN: Soft, non-tender, non-distended. Bowel sounds present. No organomegaly or mass.  EXTREMITIES: No pedal edema, cyanosis, or clubbing.  NEUROLOGIC: Cranial nerves II through XII are intact. Muscle strength 5/5 in all extremities. Sensation intact. Gait not checked.  PSYCHIATRIC: The patient is alert and oriented x 3.  SKIN: No obvious rash, lesion, or ulcer.  DATA REVIEW:   CBC Recent Labs  Lab 08/31/18 1010  WBC 13.9*  HGB 13.2  HCT 42.0  PLT 218    Chemistries  Recent Labs  Lab 08/31/18 1010 09/02/18 0343  NA 140  --   K 3.5  --   CL 105  --   CO2 27  --   GLUCOSE 155*  --   BUN 15   --   CREATININE 1.04* 0.93  CALCIUM 9.5  --   AST 15  --   ALT 10  --   ALKPHOS 72  --   BILITOT 0.8  --      Microbiology Results  Results for orders placed or performed during the hospital encounter of 08/31/18  Blood Culture (routine x 2)     Status: None (Preliminary result)   Collection Time: 08/31/18 10:10 AM  Result Value Ref Range Status   Specimen Description BLOOD LEFT AC  Final   Special Requests   Final    BOTTLES DRAWN AEROBIC AND ANAEROBIC Blood Culture adequate volume   Culture   Final    NO GROWTH 4 DAYS Performed at [facility], [address] [place], [place] [address]    Report Status PENDING  Incomplete  Blood Culture (routine x 2)     Status: None (Preliminary result)   Collection Time: 08/31/18 10:10 AM  Result Value Ref Range Status   Specimen Description BLOOD RIGHT WRIST  Final   Special Requests   Final    BOTTLES DRAWN AEROBIC AND ANAEROBIC Blood Culture adequate volume   Culture  Final    NO GROWTH 4 DAYS Performed at [facility], [address] [place], [place] [address]    Report Status PENDING  Incomplete    RADIOLOGY:  No results found.   Management plans discussed with the patient, family and they are in agreement.  CODE STATUS: Full Code   TOTAL TIME TAKING CARE OF THIS PATIENT: 35 minutes.    Shaune Pollack M.D on 09/04/2018 at 1:45 PM  Between 7am to 6pm - Pager - [phone]  After 6pm go to [email or web address] - Scientist, research (life sciences) [facility] [facility]  Office  [phone]  CC: Primary care physician; Lauro Regulus, MD   Note: This dictation was prepared with Dragon dictation along with smaller phrase technology. Any transcriptional errors that result from this process are unintentional.

## 2018-09-05 LAB — CULTURE, BLOOD (ROUTINE X 2)
CULTURE: NO GROWTH
Culture: NO GROWTH
Special Requests: ADEQUATE
Special Requests: ADEQUATE

## 2021-10-17 ENCOUNTER — Emergency Department
Admission: EM | Admit: 2021-10-17 | Discharge: 2021-10-17 | Disposition: A | Discharge status: Home / Self Care | Payer: Medicare Other | Attending: Emergency Medicine | Admitting: Emergency Medicine

## 2021-10-17 ENCOUNTER — Other Ambulatory Visit: Payer: Self-pay

## 2021-10-17 ENCOUNTER — Emergency Department: Payer: Medicare Other

## 2021-10-17 DIAGNOSIS — S93492A Sprain of other ligament of left ankle, initial encounter: Secondary | ICD-10-CM | POA: Diagnosis not present

## 2021-10-17 DIAGNOSIS — M2291 Unspecified disorder of patella, right knee: Secondary | ICD-10-CM | POA: Insufficient documentation

## 2021-10-17 DIAGNOSIS — S81811A Laceration without foreign body, right lower leg, initial encounter: Secondary | ICD-10-CM

## 2021-10-17 DIAGNOSIS — E119 Type 2 diabetes mellitus without complications: Secondary | ICD-10-CM | POA: Diagnosis not present

## 2021-10-17 DIAGNOSIS — I10 Essential (primary) hypertension: Secondary | ICD-10-CM | POA: Insufficient documentation

## 2021-10-17 DIAGNOSIS — W010XXA Fall on same level from slipping, tripping and stumbling without subsequent striking against object, initial encounter: Secondary | ICD-10-CM | POA: Diagnosis not present

## 2021-10-17 DIAGNOSIS — T07XXXA Unspecified multiple injuries, initial encounter: Secondary | ICD-10-CM

## 2021-10-17 DIAGNOSIS — S99912A Unspecified injury of left ankle, initial encounter: Secondary | ICD-10-CM | POA: Diagnosis present

## 2021-10-17 MED ORDER — HYDROCODONE-ACETAMINOPHEN 5-325 MG PO TABS
1.0000 | ORAL_TABLET | Freq: Once | ORAL | Status: AC
  Administered 2021-10-17: 1 via ORAL
  Filled 2021-10-17: qty 1

## 2021-10-17 NOTE — ED Notes (Signed)
CAM boot placed on patient. Pt able to ambulate with a walker as per usual for her. DC papers discussed with patient and family member. ?

## 2021-10-17 NOTE — ED Triage Notes (Signed)
Patient to ER via POV with family. Reports mechanical fall from standing approx 1 hour ago, states her right ankle gave out on her and slipped and fell. Denies hitting head. Denies neck pain. Denies LOC. Denies blood thinner usage.  ? ?Reports left foot pain. Skin tear present to right knee.  ?

## 2021-10-17 NOTE — ED Notes (Signed)
Verbalizes walks with walker, was not using when she fell.  ?

## 2021-10-17 NOTE — ED Provider Notes (Signed)
? ?[facility] ?Provider Note ? ? ? Event Date/Time  ? First MD Initiated Contact with Patient 10/17/21 1742   ?  (approximate) ? ? ?History  ? ?Fall ? ? ?HPI ? ?[name] Lucas is a 88 y.o. female with a past medical history of HTN, DM, anxiety, some gait instability with recurrent falls followed by neurology who presents for evaluation of some left ankle, left foot and right foot pain after she states her feet gave out from under her.  She states she scraped her right knee but does not have any significant pain in the knee.  No pain in the left knee, bilateral hips or anywhere else and patient states he did not hit her head or neck.  She denies being on any blood thinners.  No recent other sick symptoms including fevers, chills, cough, nausea, vomiting, diarrhea, rash or any other acute concerns.  The most bothersome area of pain is on the bottom of the left foot. ? ?  ?Past Medical History:  ?Diagnosis Date  ? Diabetes mellitus without complication (HCC)   ? Hypertension   ? Kidney stones   ? ? ? ?Physical Exam  ?Triage Vital Signs: ?ED Triage Vitals  ?Enc Vitals Group  ?   BP   ?   Pulse   ?   Resp   ?   Temp   ?   Temp src   ?   SpO2   ?   Weight   ?   Height   ?   Head Circumference   ?   Peak Flow   ?   Pain Score   ?   Pain Loc   ?   Pain Edu?   ?   Excl. in GC?   ? ? ?Most recent vital signs: ?Vitals:  ? 10/17/21 1930 10/17/21 1945  ?BP: (!) 149/53 (!) 139/57  ?Pulse: (!) 56 62  ?Resp:  17  ?Temp:    ?SpO2: 95% 97%  ? ? ?General: Awake, no distress.  ?CV:  Good peripheral perfusion.  2+ radial and DP pulses. ?Resp:  Normal effort.  ?Abd:  No distention.  ?Other:  No tenderness along the C/T/L-spine.  Patient has full strength and range of motion throughout the bilateral upper and lower extremities without large effusion or deformity of the bilateral ankles.  Patient does have some tenderness over the plantar aspect of the left foot just proximal to the phalangeal carpal joint without  any significant midfoot tenderness.  There is some mild tenderness over the left ATFL.  But she states she does have some pain on range of motion of the left ankle.  She also has some very subtle tenderness on the plantar aspect of the right foot without any significant overlying skin changes, effusion or deformity.  There is a superficial skin tear over the right patella without any effusion or deformity or limitation range of motion.  No pain on range of motion of the right knee. ? ? ?ED Results / Procedures / Treatments  ?Labs ?(all labs ordered are listed, but only abnormal results are displayed) ?Labs Reviewed - No data to display ? ? ?EKG ? ? ?RADIOLOGY ? ?Plain films of the left ankle and foot on my interpretation without evidence of acute fracture or dislocation.  As reviewed radiology interpretation and agree with their findings of no acute process but there is evidence of patient's prior surgical repair of her distal tibia. ? ?Plain film of the right  foot on my interpretation without evidence of acute fracture or dislocation.  I viewed radiology interpretation and agree with the findings of some degenerative changes in calcaneal spur as well as postop changes from remote distal tibia repair.  No other acute process. ? ? ?PROCEDURES: ? ?Critical Care performed: No ? ?Procedures ? ? ? ?MEDICATIONS ORDERED IN ED: ?Medications  ?HYDROcodone-acetaminophen (NORCO/VICODIN) 5-325 MG per tablet 1 tablet (1 tablet Oral Given 10/17/21 1807)  ? ? ? ?IMPRESSION / MDM / ASSESSMENT AND PLAN / ED COURSE  ?I reviewed the triage vital signs and the nursing notes. ?             ?               ? ?Differential diagnosis includes, but is not limited to contusion versus possible underlying fracture in the left foot, left ankle and right foot with very low suspicion for fracture in the right knee.  Right knee appears to be a superficial skin tear.  She is denying hitting anywhere else and has no other acute pain and I have low  suspicion for any other significant trauma at this time.  She describes her ankle getting weak which is a chronic issue for her I have a low suspicion for syncope or seizure precipitating fall. ? ?Plain films of the left ankle and foot on my interpretation without evidence of acute fracture or dislocation.  As reviewed radiology interpretation and agree with their findings of no acute process but there is evidence of patient's prior surgical repair of her distal tibia. ? ?Plain film of the right foot on my interpretation without evidence of acute fracture or dislocation.  I viewed radiology interpretation and agree with the findings of some degenerative changes in calcaneal spur as well as postop changes from remote distal tibia repair.  No other acute process. ? ?I suspect likely contusion of both feet and a left-sided ATFL ankle sprain.  Low suspicion for other significant injury at this time.  Will place in cam walking boot.  Patient ambulated at her baseline without significant difficulty following placement of boot.  We will have patient follow-up with orthopedic service given history of prior injuries in this ankle for any persistent symptoms.  Discharged in stable condition.  Strict and precautions advised and discussed. ? ?  ? ? ?FINAL CLINICAL IMPRESSION(S) / ED DIAGNOSES  ? ?Final diagnoses:  ?Sprain of anterior talofibular ligament of left ankle, initial encounter  ?Multiple contusions  ?Noninfected skin tear of right lower extremity, initial encounter  ? ? ? ?Rx / DC Orders  ? ?ED Discharge Orders   ? ? None  ? ?  ? ? ? ?Note:  This document was prepared using Dragon voice recognition software and may include unintentional dictation errors. ?  ?Gilles Chiquito, MD ?10/17/21 2026 ? ?

## 2021-11-26 ENCOUNTER — Encounter
Admission: EM | Disposition: A | Discharge status: Home / Self Care | Payer: Self-pay | Source: Home / Self Care | Attending: Emergency Medicine

## 2021-11-26 ENCOUNTER — Emergency Department: Payer: Medicare Other | Admitting: Certified Registered Nurse Anesthetist

## 2021-11-26 ENCOUNTER — Emergency Department: Payer: Medicare Other

## 2021-11-26 ENCOUNTER — Other Ambulatory Visit: Payer: Self-pay

## 2021-11-26 ENCOUNTER — Ambulatory Visit
Admission: EM | Admit: 2021-11-26 | Discharge: 2021-11-26 | Disposition: A | Discharge status: Home / Self Care | Payer: Medicare Other | Attending: Emergency Medicine | Admitting: Emergency Medicine

## 2021-11-26 DIAGNOSIS — T18128A Food in esophagus causing other injury, initial encounter: Secondary | ICD-10-CM | POA: Diagnosis not present

## 2021-11-26 DIAGNOSIS — E1122 Type 2 diabetes mellitus with diabetic chronic kidney disease: Secondary | ICD-10-CM | POA: Diagnosis not present

## 2021-11-26 DIAGNOSIS — R1310 Dysphagia, unspecified: Secondary | ICD-10-CM | POA: Diagnosis present

## 2021-11-26 DIAGNOSIS — F419 Anxiety disorder, unspecified: Secondary | ICD-10-CM | POA: Insufficient documentation

## 2021-11-26 DIAGNOSIS — K222 Esophageal obstruction: Secondary | ICD-10-CM | POA: Insufficient documentation

## 2021-11-26 DIAGNOSIS — Z87891 Personal history of nicotine dependence: Secondary | ICD-10-CM | POA: Insufficient documentation

## 2021-11-26 DIAGNOSIS — I129 Hypertensive chronic kidney disease with stage 1 through stage 4 chronic kidney disease, or unspecified chronic kidney disease: Secondary | ICD-10-CM | POA: Insufficient documentation

## 2021-11-26 DIAGNOSIS — X58XXXA Exposure to other specified factors, initial encounter: Secondary | ICD-10-CM | POA: Insufficient documentation

## 2021-11-26 DIAGNOSIS — K5790 Diverticulosis of intestine, part unspecified, without perforation or abscess without bleeding: Secondary | ICD-10-CM | POA: Insufficient documentation

## 2021-11-26 DIAGNOSIS — N182 Chronic kidney disease, stage 2 (mild): Secondary | ICD-10-CM | POA: Diagnosis not present

## 2021-11-26 DIAGNOSIS — K449 Diaphragmatic hernia without obstruction or gangrene: Secondary | ICD-10-CM | POA: Diagnosis not present

## 2021-11-26 DIAGNOSIS — T18108A Unspecified foreign body in esophagus causing other injury, initial encounter: Secondary | ICD-10-CM | POA: Diagnosis not present

## 2021-11-26 HISTORY — PX: ESOPHAGOGASTRODUODENOSCOPY: SHX5428

## 2021-11-26 LAB — CBC WITH DIFFERENTIAL/PLATELET
Abs Immature Granulocytes: 0.01 10*3/uL (ref 0.00–0.07)
Basophils Absolute: 0 10*3/uL (ref 0.0–0.1)
Basophils Relative: 1 %
Eosinophils Absolute: 0 10*3/uL (ref 0.0–0.5)
Eosinophils Relative: 0 %
HCT: 41.1 % (ref 36.0–46.0)
Hemoglobin: 13.6 g/dL (ref 12.0–15.0)
Immature Granulocytes: 0 %
Lymphocytes Relative: 27 %
Lymphs Abs: 1.7 10*3/uL (ref 0.7–4.0)
MCH: 32.5 pg (ref 26.0–34.0)
MCHC: 33.1 g/dL (ref 30.0–36.0)
MCV: 98.3 fL (ref 80.0–100.0)
Monocytes Absolute: 0.5 10*3/uL (ref 0.1–1.0)
Monocytes Relative: 8 %
Neutro Abs: 3.9 10*3/uL (ref 1.7–7.7)
Neutrophils Relative %: 64 %
Platelets: 149 10*3/uL — ABNORMAL LOW (ref 150–400)
RBC: 4.18 MIL/uL (ref 3.87–5.11)
RDW: 13.6 % (ref 11.5–15.5)
WBC: 6.1 10*3/uL (ref 4.0–10.5)
nRBC: 0 % (ref 0.0–0.2)

## 2021-11-26 LAB — COMPREHENSIVE METABOLIC PANEL
ALT: 10 U/L (ref 0–44)
AST: 17 U/L (ref 15–41)
Albumin: 3.9 g/dL (ref 3.5–5.0)
Alkaline Phosphatase: 77 U/L (ref 38–126)
Anion gap: 8 (ref 5–15)
BUN: 21 mg/dL (ref 8–23)
CO2: 28 mmol/L (ref 22–32)
Calcium: 9.8 mg/dL (ref 8.9–10.3)
Chloride: 105 mmol/L (ref 98–111)
Creatinine, Ser: 0.91 mg/dL (ref 0.44–1.00)
GFR, Estimated: >60 mL/min (ref >60)
Glucose, Bld: 113 mg/dL — ABNORMAL HIGH (ref 70–99)
Potassium: 3.9 mmol/L (ref 3.5–5.1)
Sodium: 141 mmol/L (ref 135–145)
Total Bilirubin: 0.9 mg/dL (ref 0.3–1.2)
Total Protein: 7.3 g/dL (ref 6.5–8.1)

## 2021-11-26 SURGERY — EGD (ESOPHAGOGASTRODUODENOSCOPY)
Anesthesia: General

## 2021-11-26 MED ORDER — FENTANYL CITRATE (PF) 100 MCG/2ML IJ SOLN
INTRAMUSCULAR | Status: DC | PRN
Start: 2021-11-26 — End: 2021-11-26
  Administered 2021-11-26: 25 ug via INTRAVENOUS

## 2021-11-26 MED ORDER — IOHEXOL 300 MG/ML  SOLN
100.0000 mL | Freq: Once | INTRAMUSCULAR | Status: AC | PRN
  Administered 2021-11-26: 100 mL via INTRAVENOUS

## 2021-11-26 MED ORDER — ONDANSETRON HCL 4 MG/2ML IJ SOLN: 4.0000 mg | Freq: Once | INTRAMUSCULAR | Status: DC | PRN

## 2021-11-26 MED ORDER — SUCCINYLCHOLINE CHLORIDE 200 MG/10ML IV SOSY
PREFILLED_SYRINGE | INTRAVENOUS | Status: DC | PRN
Start: 2021-11-26 — End: 2021-11-26
  Administered 2021-11-26: 100 mg via INTRAVENOUS

## 2021-11-26 MED ORDER — ACETAMINOPHEN 325 MG PO TABS: 650.0000 mg | ORAL_TABLET | Freq: Once | ORAL | Status: DC | PRN

## 2021-11-26 MED ORDER — SODIUM CHLORIDE 0.9 % IV SOLN: INTRAVENOUS | Status: DC

## 2021-11-26 MED ORDER — ONDANSETRON HCL 4 MG/2ML IJ SOLN
INTRAMUSCULAR | Status: DC | PRN
  Administered 2021-11-26: 4 mg via INTRAVENOUS

## 2021-11-26 MED ORDER — ONDANSETRON HCL 4 MG/2ML IJ SOLN
4.0000 mg | Freq: Once | INTRAMUSCULAR | Status: AC
Start: 2021-11-26 — End: 2021-11-26
  Administered 2021-11-26: 4 mg via INTRAVENOUS
  Filled 2021-11-26: qty 2

## 2021-11-26 MED ORDER — DEXAMETHASONE SODIUM PHOSPHATE 10 MG/ML IJ SOLN
INTRAMUSCULAR | Status: DC | PRN
  Administered 2021-11-26: 5 mg via INTRAVENOUS

## 2021-11-26 MED ORDER — FENTANYL CITRATE (PF) 100 MCG/2ML IJ SOLN
INTRAMUSCULAR | Status: AC
  Filled 2021-11-26: qty 2

## 2021-11-26 MED ORDER — ACETAMINOPHEN 160 MG/5ML PO SOLN
325.0000 mg | ORAL | Status: DC | PRN
  Filled 2021-11-26: qty 20.3

## 2021-11-26 MED ORDER — LACTATED RINGERS IV BOLUS
500.0000 mL | Freq: Once | INTRAVENOUS | Status: AC
  Administered 2021-11-26: 500 mL via INTRAVENOUS

## 2021-11-26 MED ORDER — PROPOFOL 10 MG/ML IV BOLUS
INTRAVENOUS | Status: DC | PRN
  Administered 2021-11-26: 100 mg via INTRAVENOUS

## 2021-11-26 MED ORDER — LIDOCAINE HCL (CARDIAC) PF 100 MG/5ML IV SOSY
PREFILLED_SYRINGE | INTRAVENOUS | Status: DC | PRN
  Administered 2021-11-26: 60 mg via INTRAVENOUS

## 2021-11-26 MED ORDER — GLUCAGON HCL RDNA (DIAGNOSTIC) 1 MG IJ SOLR
1.0000 mg | Freq: Once | INTRAMUSCULAR | Status: AC
  Administered 2021-11-26: 1 mg via INTRAVENOUS
  Filled 2021-11-26: qty 1

## 2021-11-26 NOTE — Transfer of Care (Signed)
Immediate Anesthesia Transfer of Care Note ? ?Patient: [name] Lucas ? ?Procedure(s) Performed: ESOPHAGOGASTRODUODENOSCOPY (EGD) ? ?Patient Location: PACU ? ?Anesthesia Type:General ? ?Level of Consciousness: awake ? ?Airway & Oxygen Therapy: Patient Spontanous Breathing and Patient connected to face mask oxygen ? ?Post-op Assessment: Report given to RN and Post -op Vital signs reviewed and stable ? ?Post vital signs: Reviewed and stable ? ?Last Vitals:  ?Vitals Value Taken Time  ?BP 182/73 11/26/21 1933  ?Temp 37.2 ?C 11/26/21 1933  ?Pulse 80 11/26/21 1933  ?Resp 21 11/26/21 1933  ?SpO2 100   ? ? ?Last Pain:  ?Vitals:  ? 11/26/21 1933  ?TempSrc: Temporal  ?PainSc: 0-No pain  ?   ? ?  ? ?Complications: No notable events documented. ?

## 2021-11-26 NOTE — Op Note (Signed)
[facility] ?Gastroenterology ?Patient Name: [name] Lucas ?Procedure Date: 11/26/2021 7:09 PM ?MRN: [id number] ?Account #: [id number] ?Date of Birth: 1932-01-16 ?Admit Type: Inpatient ?Age: 88 ?Room: [facility] ENDO ROOM 2 ?Gender: Female ?Note Status: Finalized ?Instrument Name: Upper Endoscope [id number] ?Procedure:             Upper GI endoscopy ?Indications:           Foreign body in the esophagus ?Providers:             Lucilla Lame MD, MD ?Referring MD:          Ocie Cornfield. Ouida Sills MD, MD (Referring MD) ?Medicines:             General Anesthesia ?Complications:         No immediate complications. ?Procedure:             Pre-Anesthesia Assessment: ?                       - Prior to the procedure, a History and Physical was  ?                       performed, and patient medications and allergies were  ?                       reviewed. The patient's tolerance of previous  ?                       anesthesia was also reviewed. The risks and benefits  ?                       of the procedure and the sedation options and risks  ?                       were discussed with the patient. All questions were  ?                       answered, and informed consent was obtained. Prior  ?                       Anticoagulants: The patient has taken no previous  ?                       anticoagulant or antiplatelet agents. ASA Grade  ?                       Assessment: II - A patient with mild systemic disease.  ?                       After reviewing the risks and benefits, the patient  ?                       was deemed in satisfactory condition to undergo the  ?                       procedure. ?                       After obtaining informed consent, the endoscope was  ?  passed under direct vision. Throughout the procedure,  ?                       the patient's blood pressure, pulse, and oxygen  ?                       saturations were monitored continuously. The Endoscope  ?                        was introduced through the mouth, and advanced to the  ?                       second part of duodenum. The upper GI endoscopy was  ?                       accomplished without difficulty. The patient tolerated  ?                       the procedure well. ?Findings: ?     Food was found at the gastroesophageal junction. Removal of food was  ?     accomplished. ?     One benign-appearing, intrinsic moderate stenosis was found at the  ?     gastroesophageal junction. ?     The stomach was normal. ?     The examined duodenum was normal. ?Impression:            - Food at the gastroesophageal junction. Removal was  ?                       successful. ?                       - Benign-appearing esophageal stenosis. ?                       - Normal stomach. ?                       - Normal examined duodenum. ?Recommendation:        - Discharge patient to home. ?                       - Mechanical soft diet. ?                       - Continue present medications. ?                       - Repeat upper endoscopy in 3 weeks for retreatment. ?Procedure Code(s):     --- Professional --- ?                       [phone], Esophagogastroduodenoscopy, flexible,  ?                       transoral; with removal of foreign body(s) ?Diagnosis Code(s):     --- Professional --- ?                       [id number], Food in esophagus causing other injury,  ?  initial encounter ?                       K22.2, Esophageal obstruction ?CPT copyright 2019 [employer]. All rights reserved. ?The codes documented in this report are preliminary and upon coder review may  ?be revised to meet current compliance requirements. ?Lucilla Lame MD, MD ?11/26/2021 7:23:07 PM ?This report has been signed electronically. ?Number of Addenda: 0 ?Note Initiated On: 11/26/2021 7:09 PM ?Estimated Blood Loss:  Estimated blood loss: none. ?     [facility] ?

## 2021-11-26 NOTE — H&P (Signed)
? ?Midge Minium, MD [facility] ?[phone] ?[place], [place] [address] ?Phone:[phone] ?Fax : [phone] ? ?Primary Care Physician:  Lauro Regulus, MD ?Primary Gastroenterologist:  Dr. Servando Snare ? ?Pre-Procedure History & Physical: ?HPI:  [name] Lucas is a 88 y.o. female is here for an endoscopy. ?  ?Past Medical History:  ?Diagnosis Date  ? Diabetes mellitus without complication (HCC)   ? Hypertension   ? Kidney stones   ? ? ?Past Surgical History:  ?Procedure Laterality Date  ? ABDOMINAL HYSTERECTOMY    ? ANKLE FRACTURE SURGERY Bilateral   ? esophageal dilitation    ? kidney stone removal    ? ? ?Prior to Admission medications   ?Medication Sig Start Date End Date Taking? Authorizing Provider  ?Cholecalciferol (VITAMIN D3) 50 MCG (2000 UT) capsule Take 2,000 Units by mouth daily.   Yes [provider]  ?[facility] VITAMIN B12 1000 MCG tablet Take 1,000 mcg by mouth daily. 08/25/18  Yes [provider]  ?DULoxetine (CYMBALTA) 20 MG capsule Take 20 mg by mouth 2 (two) times daily. 09/16/21  Yes [provider]  ?gabapentin (NEURONTIN) 100 MG capsule Take 200 mg by mouth 2 (two) times daily. 08/22/21  Yes [provider]  ?omeprazole (PRILOSEC) 20 MG capsule Take 20 mg by mouth 2 (two) times daily. 10/22/14  Yes [provider]  ?albuterol (PROVENTIL HFA;VENTOLIN HFA) 108 (90 Base) MCG/ACT inhaler Inhale 2 puffs into the lungs every 6 (six) hours as needed for wheezing or shortness of breath. ?Patient not taking: Reported on 10/17/2021 09/04/18   Shaune Pollack, MD  ?azithromycin (ZITHROMAX) 250 MG tablet Take 1 tablet (250 mg total) by mouth daily. ?Patient not taking: Reported on 10/17/2021 09/04/18   Shaune Pollack, MD  ?glimepiride (AMARYL) 1 MG tablet Take 1 mg by mouth daily. 07/12/18   [provider]  ? ? ?Allergies as of 11/26/2021 - Review Complete 11/26/2021  ?Allergen Reaction Noted  ? Sulfa antibiotics Nausea And Vomiting 04/10/2016  ? ? ?History reviewed. No  pertinent family history. ? ?Social History  ? ?Socioeconomic History  ? Marital status: Widowed  ?  Spouse name: Not on file  ? Number of children: Not on file  ? Years of education: Not on file  ? Highest education level: Not on file  ?Occupational History  ? Not on file  ?Tobacco Use  ? Smoking status: Former  ? Smokeless tobacco: Never  ?Vaping Use  ? Vaping Use: Never used  ?Substance and Sexual Activity  ? Alcohol use: No  ? Drug use: Never  ? Sexual activity: Not on file  ?Other Topics Concern  ? Not on file  ?Social History Narrative  ? Not on file  ? ?Social Determinants of Health  ? ?Financial Resource Strain: Not on file  ?Food Insecurity: Not on file  ?Transportation Needs: Not on file  ?Physical Activity: Not on file  ?Stress: Not on file  ?Social Connections: Not on file  ?Intimate Partner Violence: Not on file  ? ? ?Review of Systems: ?See HPI, otherwise negative ROS ? ?Physical Exam: ?BP (!) 165/102   Pulse 83   Temp 98.4 ?F (36.9 ?C) (Oral)   Resp 19   Ht 5\' 3"  (1.6 m)   Wt 61.2 kg   SpO2 99%   BMI 23.91 kg/m?  ?General:   Alert,  pleasant and cooperative in NAD ?Head:  Normocephalic and atraumatic. ?Neck:  Supple; no masses or thyromegaly. ?Lungs:  Clear throughout to auscultation.    ?  Heart:  Regular rate and rhythm. ?Abdomen:  Soft, nontender and nondistended. Normal bowel sounds, without guarding, and without rebound.   ?Neurologic:  Alert and  oriented x4;  grossly normal neurologically. ? ?Impression/Plan: ?[name] Lucas is here for an endoscopy to be performed for foreign body ? ?Risks, benefits, limitations, and alternatives regarding  endoscopy have been reviewed with the patient.  Questions have been answered.  All parties agreeable. ? ? ?Midge Minium, MD  11/26/2021, 7:06 PM ?

## 2021-11-26 NOTE — ED Notes (Signed)
Report given to endo at this time. ?

## 2021-11-26 NOTE — Anesthesia Procedure Notes (Signed)
Procedure Name: Intubation ?Date/Time: 11/26/2021 7:15 PM ?Performed by: Aline Brochure, CRNA ?Pre-anesthesia Checklist: Patient identified, Patient being monitored, Timeout performed, Emergency Drugs available and Suction available ?Patient Re-evaluated:Patient Re-evaluated prior to induction ?Oxygen Delivery Method: Circle system utilized ?Preoxygenation: Pre-oxygenation with 100% oxygen ?Induction Type: IV induction ?Ventilation: Mask ventilation without difficulty ?Laryngoscope Size: 3 and McGraph ?Grade View: Grade I ?Tube type: Oral ?Tube size: 7.0 mm ?Number of attempts: 1 ?Airway Equipment and Method: Stylet ?Placement Confirmation: ETT inserted through vocal cords under direct vision, positive ETCO2 and breath sounds checked- equal and bilateral ?Secured at: 21 cm ?Tube secured with: Tape ?Dental Injury: Teeth and Oropharynx as per pre-operative assessment  ? ? ? ? ?

## 2021-11-26 NOTE — ED Provider Notes (Addendum)
? ?[facility] ?Provider Note ? ? ? Event Date/Time  ? First MD Initiated Contact with Patient 11/26/21 1216   ?  (approximate) ? ? ?History  ? ?Foreign Body ? ? ?HPI ? ?[name] Lucas is a 88 y.o. female  with a past medical history of HTN, DM, anxiety, some gait instability with recurrent falls followed by neurology who presents coming by daughter with concerns that patient has a piece of chicken stuck in her esophagus or stomach somewhere.  Patient states she had some chicken last night and since then she has tried drinking several things including some cola, coffee and water but each time has spit this up.  She states she is little pressure in her substernal and epigastric area.  She denies any sore throat, headache, earache, fevers, cough, back pain, lower abdominal pain, diarrhea, constipation, urinary symptoms or any other acute concerns.  She does report she had to have her esophagus "stretched" 5 to 7 years ago but has been a long time she does not currently follow with a gastroenterologist. ? ?  ? ? ?Physical Exam  ?Triage Vital Signs: ?ED Triage Vitals  ?Enc Vitals Group  ?   BP   ?   Pulse   ?   Resp   ?   Temp   ?   Temp src   ?   SpO2   ?   Weight   ?   Height   ?   Head Circumference   ?   Peak Flow   ?   Pain Score   ?   Pain Loc   ?   Pain Edu?   ?   Excl. in GC?   ? ? ?Most recent vital signs: ?Vitals:  ? 11/26/21 1430 11/26/21 1532  ?BP: (!) 115/94 (!) 141/53  ?Pulse: 77 72  ?Resp: 17 14  ?Temp:    ?SpO2: 96% 96%  ? ? ?General: Awake, no distress. ?CV:  Slightly prolonged cap refill in the digits..  2+ radial pulses. ?Resp:  Normal effort.  Clear bilaterally. ?Abd:  No distention.  Soft throughout. ?Other:   ? ? ?ED Results / Procedures / Treatments  ?Labs ?(all labs ordered are listed, but only abnormal results are displayed) ?Labs Reviewed  ?CBC WITH DIFFERENTIAL/PLATELET - Abnormal; Notable for the following components:  ?    Result Value  ? Platelets 149 (*)   ? All other  components within normal limits  ?COMPREHENSIVE METABOLIC PANEL - Abnormal; Notable for the following components:  ? Glucose, Bld 113 (*)   ? All other components within normal limits  ? ? ? ?EKG ? ?EKG is remarkable for sinus rhythm with a ventricular rate of 89, normal axis, PR interval at 245 representing first-degree block with otherwise no significant prolongations or clear evidence of acute ischemia.  Some wandering leads in V1 and V2. ? ? ?RADIOLOGY ?Chest reviewed by myself shows no focal consoidation, effusion, edema, pneumothorax or other clear acute thoracic process. I also reviewed radiology interpretation and agree with findings described. ? ?CT abdomen pelvis on my review without evidence of an SBO or any clear obstructive process.  I reviewed radiology interpretation and agree with their findings of moderate to large sliding hiatal hernia and diverticulosis as well as thickening of the distal esophageal wall concerning for gastritis without other acute process.  There is notation of possible benign cyst versus hemangioma or lymphangioma in the spleen. ? ? ?PROCEDURES: ? ?Critical Care performed: No ? ?.  1-3 Lead EKG Interpretation ?Performed by: Gilles Chiquito, MD ?Authorized by: Gilles Chiquito, MD  ? ?  Interpretation: normal   ?  ECG rate assessment: normal   ?  Rhythm: sinus rhythm   ?  Ectopy: none   ?  Conduction: normal   ? ?The patient is on the cardiac monitor to evaluate for evidence of arrhythmia and/or significant heart rate changes. ? ? ?MEDICATIONS ORDERED IN ED: ?Medications  ?lactated ringers bolus 500 mL (500 mLs Intravenous New Bag/Given 11/26/21 1257)  ?glucagon (human recombinant) (GLUCAGEN) injection 1 mg (1 mg Intravenous Given 11/26/21 1256)  ?ondansetron (ZOFRAN) injection 4 mg (4 mg Intravenous Given 11/26/21 1256)  ?iohexol (OMNIPAQUE) 300 MG/ML solution 100 mL (100 mLs Intravenous Contrast Given 11/26/21 1501)  ? ? ? ?IMPRESSION / MDM / ASSESSMENT AND PLAN / ED COURSE  ?I  reviewed the triage vital signs and the nursing notes. ?             ?               ? ?Differential diagnosis includes, but is not limited to esophageal food impaction, SBO, gastritis, dehydration and metabolic derangements. ? ?EKG not suggestive of cardiac ischemia.  CBC without leukocytosis or acute anemia.  CMP without any significant lecture metabolic derangements. ? ?Chest reviewed by myself shows no focal consoidation, effusion, edema, pneumothorax or other clear acute thoracic process. I also reviewed radiology interpretation and agree with findings described. ? ?CT abdomen pelvis on my review without evidence of an SBO or any clear obstructive process.  I reviewed radiology interpretation and agree with their findings of moderate to large sliding hiatal hernia and diverticulosis as well as thickening of the distal esophageal wall concerning for gastritis without other acute process.  There is notation of possible benign cyst versus hemangioma or lymphangioma in the spleen. ? ?Given concern for food impaction trial of glucagon performed which was unsuccessful as patient on repeat p.o. challenge not able to keep any food nonspecific immediately. ? ?We will consult GI for possible endoscopy. ? ?Care patient signed over to assuming provider at approximately 1600.  Plan is to follow-up with GI for further management. ? ? ?FINAL CLINICAL IMPRESSION(S) / ED DIAGNOSES  ? ?Final diagnoses:  ?Dysphagia, unspecified type  ?Hiatal hernia  ?Diverticulosis  ? ? ? ?Rx / DC Orders  ? ?ED Discharge Orders   ? ? None  ? ?  ? ? ? ?Note:  This document was prepared using Dragon voice recognition software and may include unintentional dictation errors. ?  ?Gilles Chiquito, MD ?11/26/21 1602 ? ?  ?Gilles Chiquito, MD ?11/26/21 1617 ? ?  ?Gilles Chiquito, MD ?11/26/21 1950 ? ?

## 2021-11-26 NOTE — Anesthesia Preprocedure Evaluation (Addendum)
Anesthesia Evaluation  ?Patient identified by MRN, date of birth, ID band ?Patient awake ? ? ? ?Reviewed: ?Allergy & Precautions, NPO status , Patient's Chart, lab work & pertinent test results ? ?History of Anesthesia Complications ?Negative for: history of anesthetic complications ? ?Airway ?Mallampati: I ? ? ?Neck ROM: Full ? ? ? Dental ? ?(+) Lower Dentures, Upper Dentures ?  ?Pulmonary ?former smoker (quit greater than 50 years ago),  ?  ?Pulmonary exam normal ?breath sounds clear to auscultation ? ? ? ? ? ? Cardiovascular ?hypertension, Normal cardiovascular exam ?Rhythm:Regular Rate:Normal ? ?ECG 11/26/21:  ?Sinus rhythm ?Prolonged PR interval ?LVH with IVCD, LAD and secondary repol abnrm ?Baseline wander in lead(s) I aVL V6 ? ?Echo 2016: normal ?  ?Neuro/Psych ?PSYCHIATRIC DISORDERS Anxiety negative neurological ROS ?   ? GI/Hepatic ?Hx prior esophageal dilation ?  ?Endo/Other  ?diabetes, Type 2 ? Renal/GU ?Renal disease (stage II CKD; nephrolithiasis)  ? ?  ?Musculoskeletal ? ? Abdominal ?  ?Peds ? Hematology ?negative hematology ROS ?(+)   ?Anesthesia Other Findings ?Food impaction ? Reproductive/Obstetrics ? ?  ? ? ? ? ? ? ? ? ? ? ? ? ? ?  ?  ? ? ? ? ? ? ? ?Anesthesia Physical ?Anesthesia Plan ? ?ASA: 2 and emergent ? ?Anesthesia Plan: General  ? ?Post-op Pain Management:   ? ?Induction: Intravenous and Rapid sequence ? ?PONV Risk Score and Plan: 3 and Ondansetron and Treatment may vary due to age or medical condition ? ?Airway Management Planned: Oral ETT ? ?Additional Equipment:  ? ?Intra-op Plan:  ? ?Post-operative Plan: Extubation in OR ? ?Informed Consent: I have reviewed the patients History and Physical, chart, labs and discussed the procedure including the risks, benefits and alternatives for the proposed anesthesia with the patient or authorized representative who has indicated his/her understanding and acceptance.  ? ? ? ?Dental advisory given ? ?Plan Discussed  with: CRNA ? ?Anesthesia Plan Comments: (Patient consented for risks of anesthesia including but not limited to:  ?- adverse reactions to medications ?- damage to eyes, teeth, lips or other oral mucosa ?- nerve damage due to positioning  ?- sore throat or hoarseness ?- damage to heart, brain, nerves, lungs, other parts of body or loss of life ? ?Informed patient about role of CRNA in peri- and intra-operative care.  Patient voiced understanding.)  ? ? ? ? ? ?Anesthesia Quick Evaluation ? ?

## 2021-11-26 NOTE — ED Notes (Signed)
Patient given water at this time.  

## 2021-11-26 NOTE — ED Triage Notes (Signed)
Pt states she was eating chicken yesterday and it got stuck, states she thought it would pass but it has not. Pt is in NAD on arrival ,respirations WNL. Unable to pass liquids or food. ?

## 2021-11-27 ENCOUNTER — Encounter: Payer: Self-pay | Admitting: Gastroenterology

## 2021-11-27 NOTE — Anesthesia Postprocedure Evaluation (Signed)
Anesthesia Post Note ? ?Patient: ZINEB GLADE ? ?Procedure(s) Performed: ESOPHAGOGASTRODUODENOSCOPY (EGD) ? ?Patient location during evaluation: PACU ?Anesthesia Type: General ?Level of consciousness: awake and alert, oriented and patient cooperative ?Pain management: pain level controlled ?Vital Signs Assessment: post-procedure vital signs reviewed and stable ?Respiratory status: spontaneous breathing, nonlabored ventilation and respiratory function stable ?Cardiovascular status: blood pressure returned to baseline and stable ?Postop Assessment: adequate PO intake ?Anesthetic complications: no ? ? ?No notable events documented. ? ? ?Last Vitals:  ?Vitals:  ? 11/26/21 1946 11/26/21 2001  ?BP: (!) 171/80 (!) 156/64  ?Pulse: 74 68  ?Resp: 16 14  ?Temp:    ?SpO2: 95% 95%  ?  ?Last Pain:  ?Vitals:  ? 11/26/21 2001  ?TempSrc:   ?PainSc: 0-No pain  ? ? ?  ?  ?  ?  ?  ?  ? ?Reed Breech ? ? ? ? ?

## 2022-04-20 ENCOUNTER — Ambulatory Visit: Payer: Medicare Other | Admitting: Gastroenterology

## 2022-12-10 ENCOUNTER — Other Ambulatory Visit: Payer: Self-pay | Admitting: Internal Medicine

## 2022-12-10 DIAGNOSIS — R29898 Other symptoms and signs involving the musculoskeletal system: Secondary | ICD-10-CM

## 2022-12-25 ENCOUNTER — Ambulatory Visit
Admission: RE | Admit: 2022-12-25 | Discharge: 2022-12-25 | Disposition: A | Discharge status: Home / Self Care | Payer: Medicare Other | Source: Ambulatory Visit | Attending: Internal Medicine | Admitting: Internal Medicine

## 2022-12-25 DIAGNOSIS — R29898 Other symptoms and signs involving the musculoskeletal system: Secondary | ICD-10-CM

## 2023-05-06 ENCOUNTER — Other Ambulatory Visit (INDEPENDENT_AMBULATORY_CARE_PROVIDER_SITE_OTHER): Payer: Self-pay | Admitting: Nurse Practitioner

## 2023-05-06 DIAGNOSIS — L97529 Non-pressure chronic ulcer of other part of left foot with unspecified severity: Secondary | ICD-10-CM

## 2023-05-12 ENCOUNTER — Encounter (INDEPENDENT_AMBULATORY_CARE_PROVIDER_SITE_OTHER): Payer: Self-pay | Admitting: Nurse Practitioner

## 2023-05-12 ENCOUNTER — Ambulatory Visit (INDEPENDENT_AMBULATORY_CARE_PROVIDER_SITE_OTHER): Payer: Medicare Other | Admitting: Nurse Practitioner

## 2023-05-12 ENCOUNTER — Ambulatory Visit (INDEPENDENT_AMBULATORY_CARE_PROVIDER_SITE_OTHER): Payer: Medicare Other

## 2023-05-12 VITALS — BP 161/62 | HR 41 | Resp 18 | Ht 62.0 in | Wt 135.0 lb

## 2023-05-12 DIAGNOSIS — I7025 Atherosclerosis of native arteries of other extremities with ulceration: Secondary | ICD-10-CM | POA: Diagnosis not present

## 2023-05-12 DIAGNOSIS — L97529 Non-pressure chronic ulcer of other part of left foot with unspecified severity: Secondary | ICD-10-CM | POA: Diagnosis not present

## 2023-05-12 DIAGNOSIS — I10 Essential (primary) hypertension: Secondary | ICD-10-CM | POA: Diagnosis not present

## 2023-05-13 ENCOUNTER — Telehealth (INDEPENDENT_AMBULATORY_CARE_PROVIDER_SITE_OTHER): Payer: Self-pay

## 2023-05-13 NOTE — Telephone Encounter (Signed)
I attempted to contact the patient and a message was left for the daughter to return my call to schedule the patient for a RLE angio with Dr. Gilda Crease.

## 2023-05-16 ENCOUNTER — Telehealth (INDEPENDENT_AMBULATORY_CARE_PROVIDER_SITE_OTHER): Payer: Self-pay

## 2023-05-16 NOTE — Telephone Encounter (Signed)
I returned the patient's daughter call and the patient is now scheduled with Dr. Wyn Quaker on 05/23/23 with a 6:45 am arrival time to the [facility] for a RLE angio. Pre-procedure instructions were discussed and will be mailed.

## 2023-05-23 ENCOUNTER — Ambulatory Visit
Admission: RE | Admit: 2023-05-23 | Discharge: 2023-05-23 | Disposition: A | Discharge status: Home / Self Care | Payer: Medicare Other | Attending: Vascular Surgery | Admitting: Vascular Surgery

## 2023-05-23 ENCOUNTER — Encounter: Payer: Self-pay | Admitting: Vascular Surgery

## 2023-05-23 ENCOUNTER — Other Ambulatory Visit: Payer: Self-pay

## 2023-05-23 ENCOUNTER — Encounter
Admission: RE | Disposition: A | Discharge status: Home / Self Care | Payer: Self-pay | Source: Home / Self Care | Attending: Vascular Surgery

## 2023-05-23 DIAGNOSIS — E1151 Type 2 diabetes mellitus with diabetic peripheral angiopathy without gangrene: Secondary | ICD-10-CM | POA: Insufficient documentation

## 2023-05-23 DIAGNOSIS — I10 Essential (primary) hypertension: Secondary | ICD-10-CM | POA: Insufficient documentation

## 2023-05-23 DIAGNOSIS — L97519 Non-pressure chronic ulcer of other part of right foot with unspecified severity: Secondary | ICD-10-CM | POA: Diagnosis not present

## 2023-05-23 DIAGNOSIS — E11621 Type 2 diabetes mellitus with foot ulcer: Secondary | ICD-10-CM | POA: Insufficient documentation

## 2023-05-23 DIAGNOSIS — I70235 Atherosclerosis of native arteries of right leg with ulceration of other part of foot: Secondary | ICD-10-CM | POA: Diagnosis not present

## 2023-05-23 DIAGNOSIS — I743 Embolism and thrombosis of arteries of the lower extremities: Secondary | ICD-10-CM | POA: Diagnosis not present

## 2023-05-23 DIAGNOSIS — Z87891 Personal history of nicotine dependence: Secondary | ICD-10-CM | POA: Insufficient documentation

## 2023-05-23 DIAGNOSIS — L97909 Non-pressure chronic ulcer of unspecified part of unspecified lower leg with unspecified severity: Secondary | ICD-10-CM

## 2023-05-23 HISTORY — PX: LOWER EXTREMITY ANGIOGRAPHY: CATH118251

## 2023-05-23 LAB — BUN: BUN: 21 mg/dL (ref 8–23)

## 2023-05-23 LAB — GLUCOSE, CAPILLARY: Glucose-Capillary: 105 mg/dL — ABNORMAL HIGH (ref 70–99)

## 2023-05-23 LAB — CREATININE, SERUM
Creatinine, Ser: 0.85 mg/dL (ref 0.44–1.00)
GFR, Estimated: >60 mL/min

## 2023-05-23 SURGERY — LOWER EXTREMITY ANGIOGRAPHY
Anesthesia: Moderate Sedation | Site: Leg Lower | Laterality: Right

## 2023-05-23 MED ORDER — IODIXANOL 320 MG/ML IV SOLN
INTRAVENOUS | Status: DC | PRN
  Administered 2023-05-23: 55 mL via INTRA_ARTERIAL

## 2023-05-23 MED ORDER — METHYLPREDNISOLONE SODIUM SUCC 125 MG IJ SOLR: 125.0000 mg | Freq: Once | INTRAMUSCULAR | Status: DC | PRN

## 2023-05-23 MED ORDER — SODIUM CHLORIDE 0.9% FLUSH: 3.0000 mL | Freq: Two times a day (BID) | INTRAVENOUS | Status: DC

## 2023-05-23 MED ORDER — FENTANYL CITRATE (PF) 100 MCG/2ML IJ SOLN
INTRAMUSCULAR | Status: DC | PRN
  Administered 2023-05-23: 25 ug via INTRAVENOUS

## 2023-05-23 MED ORDER — CLOPIDOGREL BISULFATE 75 MG PO TABS
75.0000 mg | ORAL_TABLET | Freq: Every day | ORAL | Status: DC
  Administered 2023-05-23: 75 mg via ORAL

## 2023-05-23 MED ORDER — MIDAZOLAM HCL 2 MG/2ML IJ SOLN
INTRAMUSCULAR | Status: DC | PRN
  Administered 2023-05-23: 1 mg via INTRAVENOUS

## 2023-05-23 MED ORDER — DIPHENHYDRAMINE HCL 50 MG/ML IJ SOLN: 50.0000 mg | Freq: Once | INTRAMUSCULAR | Status: DC | PRN

## 2023-05-23 MED ORDER — CEFAZOLIN SODIUM-DEXTROSE 2-4 GM/100ML-% IV SOLN
INTRAVENOUS | Status: AC
  Filled 2023-05-23: qty 100

## 2023-05-23 MED ORDER — MIDAZOLAM HCL 5 MG/5ML IJ SOLN
INTRAMUSCULAR | Status: AC
  Filled 2023-05-23: qty 5

## 2023-05-23 MED ORDER — ASPIRIN 81 MG PO TBEC
81.0000 mg | DELAYED_RELEASE_TABLET | Freq: Every day | ORAL | 2 refills | Status: AC
Start: 2023-05-23 — End: 2024-05-22

## 2023-05-23 MED ORDER — LIDOCAINE-EPINEPHRINE (PF) 1 %-1:200000 IJ SOLN
INTRAMUSCULAR | Status: DC | PRN
  Administered 2023-05-23: 10 mL

## 2023-05-23 MED ORDER — SODIUM CHLORIDE 0.9% FLUSH: 3.0000 mL | INTRAVENOUS | Status: DC | PRN

## 2023-05-23 MED ORDER — LABETALOL HCL 5 MG/ML IV SOLN: 10.0000 mg | INTRAVENOUS | Status: DC | PRN

## 2023-05-23 MED ORDER — HEPARIN SODIUM (PORCINE) 1000 UNIT/ML IJ SOLN
INTRAMUSCULAR | Status: AC
  Filled 2023-05-23: qty 10

## 2023-05-23 MED ORDER — ONDANSETRON HCL 4 MG/2ML IJ SOLN: 4.0000 mg | Freq: Four times a day (QID) | INTRAMUSCULAR | Status: DC | PRN

## 2023-05-23 MED ORDER — HYDRALAZINE HCL 20 MG/ML IJ SOLN: 5.0000 mg | INTRAMUSCULAR | Status: DC | PRN

## 2023-05-23 MED ORDER — MIDAZOLAM HCL 2 MG/ML PO SYRP: 8.0000 mg | ORAL_SOLUTION | Freq: Once | ORAL | Status: DC | PRN

## 2023-05-23 MED ORDER — ACETAMINOPHEN 325 MG PO TABS: 650.0000 mg | ORAL_TABLET | ORAL | Status: DC | PRN

## 2023-05-23 MED ORDER — SODIUM CHLORIDE 0.9 % IV SOLN: INTRAVENOUS | Status: DC

## 2023-05-23 MED ORDER — CLOPIDOGREL BISULFATE 75 MG PO TABS: 75.0000 mg | ORAL_TABLET | Freq: Every day | ORAL | 11 refills | Status: DC

## 2023-05-23 MED ORDER — FENTANYL CITRATE (PF) 100 MCG/2ML IJ SOLN
INTRAMUSCULAR | Status: AC
  Filled 2023-05-23: qty 2

## 2023-05-23 MED ORDER — CEFAZOLIN SODIUM-DEXTROSE 2-4 GM/100ML-% IV SOLN
2.0000 g | INTRAVENOUS | Status: AC
  Administered 2023-05-23: 2 g via INTRAVENOUS

## 2023-05-23 MED ORDER — SODIUM CHLORIDE 0.9 % IV SOLN: 250.0000 mL | INTRAVENOUS | Status: DC | PRN

## 2023-05-23 MED ORDER — HEPARIN (PORCINE) IN NACL 2000-0.9 UNIT/L-% IV SOLN
INTRAVENOUS | Status: DC | PRN
  Administered 2023-05-23: 1000 mL

## 2023-05-23 MED ORDER — FAMOTIDINE 20 MG PO TABS: 40.0000 mg | ORAL_TABLET | Freq: Once | ORAL | Status: DC | PRN

## 2023-05-23 MED ORDER — HEPARIN SODIUM (PORCINE) 1000 UNIT/ML IJ SOLN
INTRAMUSCULAR | Status: DC | PRN
  Administered 2023-05-23: 5000 [IU] via INTRAVENOUS

## 2023-05-23 MED ORDER — ASPIRIN 81 MG PO TBEC: 81.0000 mg | DELAYED_RELEASE_TABLET | Freq: Every day | ORAL | Status: DC

## 2023-05-23 MED ORDER — ASPIRIN 81 MG PO TBEC
DELAYED_RELEASE_TABLET | ORAL | Status: AC
  Administered 2023-05-23: 81 mg via ORAL
  Filled 2023-05-23: qty 1

## 2023-05-23 MED ORDER — CLOPIDOGREL BISULFATE 75 MG PO TABS
ORAL_TABLET | ORAL | Status: AC
  Filled 2023-05-23: qty 1

## 2023-05-23 MED ORDER — HYDROMORPHONE HCL 1 MG/ML IJ SOLN: 1.0000 mg | Freq: Once | INTRAMUSCULAR | Status: DC | PRN

## 2023-05-23 SURGICAL SUPPLY — 25 items
BALLN LUTONIX 018 4X220X130 (BALLOONS) ×1
BALLN LUTONIX 018 4X300X130 (BALLOONS) ×1
BALLN LUTONIX 4X150X130 (BALLOONS) ×1
BALLN ULTRVRSE 3X300X150 (BALLOONS) ×1
BALLN ULTRVRSE 3X300X150 OTW (BALLOONS) ×1
BALLOON LUTONIX 018 4X220X130 (BALLOONS) IMPLANT
BALLOON LUTONIX 018 4X300X130 (BALLOONS) IMPLANT
BALLOON LUTONIX 4X150X130 (BALLOONS) IMPLANT
BALLOON ULTRVRSE 3X300X150 OTW (BALLOONS) IMPLANT
CATH ANGIO 5F PIGTAIL 65CM (CATHETERS) IMPLANT
CATH BEACON 5 .038 100 VERT TP (CATHETERS) IMPLANT
CATH ROTAREX 135 6FR (CATHETERS) IMPLANT
COVER PROBE ULTRASOUND 5X96 (MISCELLANEOUS) IMPLANT
DEVICE STARCLOSE SE CLOSURE (Vascular Products) IMPLANT
GLIDEWIRE ADV .035X260CM (WIRE) IMPLANT
KIT ENCORE 26 ADVANTAGE (KITS) IMPLANT
PACK ANGIOGRAPHY (CUSTOM PROCEDURE TRAY) ×2 IMPLANT
SHEATH ANL2 6FRX45 HC (SHEATH) IMPLANT
SHEATH BRITE TIP 5FRX11 (SHEATH) IMPLANT
STENT LIFESTENT 5F 6X150X135 (Permanent Stent) IMPLANT
STENT VIABAHN 6X250X120 (Permanent Stent) IMPLANT
SYR MEDRAD MARK 7 150ML (SYRINGE) IMPLANT
TUBING CONTRAST HIGH PRESS 72 (TUBING) IMPLANT
WIRE G V18X300CM (WIRE) IMPLANT
WIRE GUIDERIGHT .035X150 (WIRE) IMPLANT

## 2023-05-23 NOTE — H&P (Signed)
[facility] Admission History & Physical  MRN : [id number]  [name] Lucas is a 89 y.o. (1932/07/29) female who presents with chief complaint of No chief complaint on file. Marland Kitchen  History of Present Illness: Patient presents for angiography of the right lower extremity due to nonhealing ulcerations and abnormal flow seen on noninvasive studies performed in our office previously.  Persistent ulceration has been slow to heal.  Foot is painful.  No fevers or chills.  Current Facility-Administered Medications  Medication Dose Route Frequency Provider Last Rate Last Admin   0.9 %  sodium chloride infusion   Intravenous Continuous Georgiana Spinner, NP 75 mL/hr at 05/23/23 0732 New Bag at 05/23/23 0732   ceFAZolin (ANCEF) IVPB 2g/100 mL premix  2 g Intravenous 30 min Pre-Op Georgiana Spinner, NP       diphenhydrAMINE (BENADRYL) injection 50 mg  50 mg Intravenous Once PRN Georgiana Spinner, NP       famotidine (PEPCID) tablet 40 mg  40 mg Oral Once PRN Georgiana Spinner, NP       HYDROmorphone (DILAUDID) injection 1 mg  1 mg Intravenous Once PRN Georgiana Spinner, NP       methylPREDNISolone sodium succinate (SOLU-MEDROL) 125 mg/2 mL injection 125 mg  125 mg Intravenous Once PRN Georgiana Spinner, NP       midazolam (VERSED) 2 MG/ML syrup 8 mg  8 mg Oral Once PRN Georgiana Spinner, NP       ondansetron [facility] injection 4 mg  4 mg Intravenous Q6H PRN Georgiana Spinner, NP        Past Medical History:  Diagnosis Date   Diabetes mellitus without complication (HCC)    Hypertension    Kidney stones     Past Surgical History:  Procedure Laterality Date   ABDOMINAL HYSTERECTOMY     ANKLE FRACTURE SURGERY Bilateral    esophageal dilitation     ESOPHAGOGASTRODUODENOSCOPY N/A 11/26/2021   Procedure: ESOPHAGOGASTRODUODENOSCOPY (EGD);  Surgeon: Midge Minium, MD;  Location: [facility] ENDOSCOPY;  Service: Gastroenterology;  Laterality: N/A;   kidney stone removal       Social History   Tobacco  Use   Smoking status: Former   Smokeless tobacco: Never  Vaping Use   Vaping status: Never Used  Substance Use Topics   Alcohol use: No   Drug use: Never     History reviewed. No pertinent family history.  Allergies  Allergen Reactions   Amoxicillin Other (See Comments)    sick  sick  sick   Ciprofloxacin Other (See Comments)    sick   Felodipine Other (See Comments)    n/v  n/v  n/v   Statins Other (See Comments)   Sulfa Antibiotics Nausea And Vomiting     REVIEW OF SYSTEMS (Negative unless checked)  Constitutional: [] Weight loss  [] Fever  [] Chills Cardiac: [] Chest pain   [] Chest pressure   [] Palpitations   [] Shortness of breath when laying flat   [] Shortness of breath at rest   [] Shortness of breath with exertion. Vascular:  [] Pain in legs with walking   [] Pain in legs at rest   [] Pain in legs when laying flat   [] Claudication   [] Pain in feet when walking  [] Pain in feet at rest  [] Pain in feet when laying flat   [] History of DVT   [] Phlebitis   [] Swelling in legs   [] Varicose veins   [x] Non-healing ulcers Pulmonary:   [] Uses home oxygen   [] Productive  [facility] Admission History & Physical  MRN : [id number]  [name] Lucas is a 89 y.o. (1932/07/29) female who presents with chief complaint of No chief complaint on file. Marland Kitchen  History of Present Illness: Patient presents for angiography of the right lower extremity due to nonhealing ulcerations and abnormal flow seen on noninvasive studies performed in our office previously.  Persistent ulceration has been slow to heal.  Foot is painful.  No fevers or chills.  Current Facility-Administered Medications  Medication Dose Route Frequency Provider Last Rate Last Admin   0.9 %  sodium chloride infusion   Intravenous Continuous Georgiana Spinner, NP 75 mL/hr at 05/23/23 0732 New Bag at 05/23/23 0732   ceFAZolin (ANCEF) IVPB 2g/100 mL premix  2 g Intravenous 30 min Pre-Op Georgiana Spinner, NP       diphenhydrAMINE (BENADRYL) injection 50 mg  50 mg Intravenous Once PRN Georgiana Spinner, NP       famotidine (PEPCID) tablet 40 mg  40 mg Oral Once PRN Georgiana Spinner, NP       HYDROmorphone (DILAUDID) injection 1 mg  1 mg Intravenous Once PRN Georgiana Spinner, NP       methylPREDNISolone sodium succinate (SOLU-MEDROL) 125 mg/2 mL injection 125 mg  125 mg Intravenous Once PRN Georgiana Spinner, NP       midazolam (VERSED) 2 MG/ML syrup 8 mg  8 mg Oral Once PRN Georgiana Spinner, NP       ondansetron [facility] injection 4 mg  4 mg Intravenous Q6H PRN Georgiana Spinner, NP        Past Medical History:  Diagnosis Date   Diabetes mellitus without complication (HCC)    Hypertension    Kidney stones     Past Surgical History:  Procedure Laterality Date   ABDOMINAL HYSTERECTOMY     ANKLE FRACTURE SURGERY Bilateral    esophageal dilitation     ESOPHAGOGASTRODUODENOSCOPY N/A 11/26/2021   Procedure: ESOPHAGOGASTRODUODENOSCOPY (EGD);  Surgeon: Midge Minium, MD;  Location: [facility] ENDOSCOPY;  Service: Gastroenterology;  Laterality: N/A;   kidney stone removal       Social History   Tobacco  Use   Smoking status: Former   Smokeless tobacco: Never  Vaping Use   Vaping status: Never Used  Substance Use Topics   Alcohol use: No   Drug use: Never     History reviewed. No pertinent family history.  Allergies  Allergen Reactions   Amoxicillin Other (See Comments)    sick  sick  sick   Ciprofloxacin Other (See Comments)    sick   Felodipine Other (See Comments)    n/v  n/v  n/v   Statins Other (See Comments)   Sulfa Antibiotics Nausea And Vomiting     REVIEW OF SYSTEMS (Negative unless checked)  Constitutional: [] Weight loss  [] Fever  [] Chills Cardiac: [] Chest pain   [] Chest pressure   [] Palpitations   [] Shortness of breath when laying flat   [] Shortness of breath at rest   [] Shortness of breath with exertion. Vascular:  [] Pain in legs with walking   [] Pain in legs at rest   [] Pain in legs when laying flat   [] Claudication   [] Pain in feet when walking  [] Pain in feet at rest  [] Pain in feet when laying flat   [] History of DVT   [] Phlebitis   [] Swelling in legs   [] Varicose veins   [x] Non-healing ulcers Pulmonary:   [] Uses home oxygen   [] Productive  cough   [] Hemoptysis   [] Wheeze  [] COPD   [] Asthma Neurologic:  [] Dizziness  [] Blackouts   [] Seizures   [] History of stroke   [] History of TIA  [] Aphasia   [] Temporary blindness   [] Dysphagia   [] Weakness or numbness in arms   [] Weakness or numbness in legs Musculoskeletal:  [x] Arthritis   [] Joint swelling   [x] Joint pain   [] Low back pain Hematologic:  [] Easy bruising  [] Easy bleeding   [] Hypercoagulable state   [] Anemic  [] Hepatitis Gastrointestinal:  [] Blood in stool   [] Vomiting blood  [] Gastroesophageal reflux/heartburn   [] Difficulty swallowing. Genitourinary:  [] Chronic kidney disease   [] Difficult urination  [] Frequent urination  [] Burning with urination   [] Blood in urine Skin:  [] Rashes   [x] Ulcers   [x] Wounds Psychological:  [] History of anxiety   []  History of major depression.  Physical  Examination  Vitals:   05/23/23 0731  BP: (!) 162/71  Pulse: 78  Resp: 17  Temp: 97.8 F (36.6 C)  TempSrc: Oral  SpO2: 96%  Weight: 61.2 kg  Height: 5\' 2"  (1.575 m)   Body mass index is 24.69 kg/m. Gen: WD/WN, NAD. Appears younger than stated age. Head: [place]/AT, No temporalis wasting.  Ear/Nose/Throat: Hearing grossly intact, nares w/o erythema or drainage, oropharynx w/o Erythema/Exudate,  Eyes: Conjunctiva clear, sclera non-icteric Neck: Trachea midline.  No JVD.  Pulmonary:  Good air movement, respirations not labored, no use of accessory muscles.  Cardiac: irregular Vascular:  Vessel Right Left  Radial Palpable Palpable                          PT Not Palpable 1+ Palpable  DP 1+ Palpable 1+ Palpable   Gastrointestinal: soft, non-tender/non-distended. No guarding/reflex.  Musculoskeletal: M/S 5/5 throughout.  Extremities without ischemic changes.  No deformity or atrophy.  Neurologic: Sensation grossly intact in extremities.  Symmetrical.  Speech is fluent. Motor exam as listed above. Psychiatric: Judgment intact, Mood & affect appropriate for pt's clinical situation. Dermatologic: ulcer on the right foot dressed.     CBC Lab Results  Component Value Date   WBC 6.1 11/26/2021   HGB 13.6 11/26/2021   HCT 41.1 11/26/2021   MCV 98.3 11/26/2021   PLT 149 (L) 11/26/2021    BMET    Component Value Date/Time   NA 141 11/26/2021 1255   NA 143 02/06/2013 0558   K 3.9 11/26/2021 1255   K 4.4 02/06/2013 0558   CL 105 11/26/2021 1255   CL 108 (H) 02/06/2013 0558   CO2 28 11/26/2021 1255   CO2 31 02/06/2013 0558   GLUCOSE 113 (H) 11/26/2021 1255   GLUCOSE 121 (H) 02/06/2013 0558   BUN 21 05/23/2023 0719   BUN 16 02/06/2013 0558   CREATININE 0.85 05/23/2023 0719   CREATININE 1.04 02/06/2013 0558   CALCIUM 9.8 11/26/2021 1255   CALCIUM 9.3 02/06/2013 0558   GFRNONAA >60 05/23/2023 0719   GFRNONAA 51 (L) 02/06/2013 0558   GFRAA >60 09/02/2018 0343   GFRAA  60 (L) 02/06/2013 0558   Estimated Creatinine Clearance: 38.6 mL/min (by C-G formula based on SCr of 0.85 mg/dL).  COAG No results found for: "INR", "PROTIME"  Radiology VAS [place] ABI WITH/WO TBI  Result Date: 05/16/2023  LOWER EXTREMITY DOPPLER STUDY Patient Name:  [name] Lucas  Date of Exam:   05/12/2023 Medical Rec #: [id number]       Accession #:    [id number] Date of Birth: [birth year]  cough   [] Hemoptysis   [] Wheeze  [] COPD   [] Asthma Neurologic:  [] Dizziness  [] Blackouts   [] Seizures   [] History of stroke   [] History of TIA  [] Aphasia   [] Temporary blindness   [] Dysphagia   [] Weakness or numbness in arms   [] Weakness or numbness in legs Musculoskeletal:  [x] Arthritis   [] Joint swelling   [x] Joint pain   [] Low back pain Hematologic:  [] Easy bruising  [] Easy bleeding   [] Hypercoagulable state   [] Anemic  [] Hepatitis Gastrointestinal:  [] Blood in stool   [] Vomiting blood  [] Gastroesophageal reflux/heartburn   [] Difficulty swallowing. Genitourinary:  [] Chronic kidney disease   [] Difficult urination  [] Frequent urination  [] Burning with urination   [] Blood in urine Skin:  [] Rashes   [x] Ulcers   [x] Wounds Psychological:  [] History of anxiety   []  History of major depression.  Physical  Examination  Vitals:   05/23/23 0731  BP: (!) 162/71  Pulse: 78  Resp: 17  Temp: 97.8 F (36.6 C)  TempSrc: Oral  SpO2: 96%  Weight: 61.2 kg  Height: 5\' 2"  (1.575 m)   Body mass index is 24.69 kg/m. Gen: WD/WN, NAD. Appears younger than stated age. Head: [place]/AT, No temporalis wasting.  Ear/Nose/Throat: Hearing grossly intact, nares w/o erythema or drainage, oropharynx w/o Erythema/Exudate,  Eyes: Conjunctiva clear, sclera non-icteric Neck: Trachea midline.  No JVD.  Pulmonary:  Good air movement, respirations not labored, no use of accessory muscles.  Cardiac: irregular Vascular:  Vessel Right Left  Radial Palpable Palpable                          PT Not Palpable 1+ Palpable  DP 1+ Palpable 1+ Palpable   Gastrointestinal: soft, non-tender/non-distended. No guarding/reflex.  Musculoskeletal: M/S 5/5 throughout.  Extremities without ischemic changes.  No deformity or atrophy.  Neurologic: Sensation grossly intact in extremities.  Symmetrical.  Speech is fluent. Motor exam as listed above. Psychiatric: Judgment intact, Mood & affect appropriate for pt's clinical situation. Dermatologic: ulcer on the right foot dressed.     CBC Lab Results  Component Value Date   WBC 6.1 11/26/2021   HGB 13.6 11/26/2021   HCT 41.1 11/26/2021   MCV 98.3 11/26/2021   PLT 149 (L) 11/26/2021    BMET    Component Value Date/Time   NA 141 11/26/2021 1255   NA 143 02/06/2013 0558   K 3.9 11/26/2021 1255   K 4.4 02/06/2013 0558   CL 105 11/26/2021 1255   CL 108 (H) 02/06/2013 0558   CO2 28 11/26/2021 1255   CO2 31 02/06/2013 0558   GLUCOSE 113 (H) 11/26/2021 1255   GLUCOSE 121 (H) 02/06/2013 0558   BUN 21 05/23/2023 0719   BUN 16 02/06/2013 0558   CREATININE 0.85 05/23/2023 0719   CREATININE 1.04 02/06/2013 0558   CALCIUM 9.8 11/26/2021 1255   CALCIUM 9.3 02/06/2013 0558   GFRNONAA >60 05/23/2023 0719   GFRNONAA 51 (L) 02/06/2013 0558   GFRAA >60 09/02/2018 0343   GFRAA  60 (L) 02/06/2013 0558   Estimated Creatinine Clearance: 38.6 mL/min (by C-G formula based on SCr of 0.85 mg/dL).  COAG No results found for: "INR", "PROTIME"  Radiology VAS [place] ABI WITH/WO TBI  Result Date: 05/16/2023  LOWER EXTREMITY DOPPLER STUDY Patient Name:  [name] Lucas  Date of Exam:   05/12/2023 Medical Rec #: [id number]       Accession #:    [id number] Date of Birth: [birth year]

## 2023-05-23 NOTE — Op Note (Signed)
[place] [facility]  Percutaneous Study/Intervention Procedural Note   Date of Surgery: 05/23/2023  Surgeon(s):Pollyanna Levay    Assistants:none  Pre-operative Diagnosis: PAD with ulceration right lower extremity  Post-operative diagnosis:  Same  Procedure(s) Performed:             1.  Ultrasound guidance for vascular access left femoral artery             2.  Catheter placement into right common femoral artery from left femoral approach             3.  Aortogram and selective right lower extremity angiogram             4.  Percutaneous transluminal angioplasty of right anterior tibial artery with 3 mm diameter angioplasty balloon             5.  Mechanical thrombectomy of the right SFA and popliteal arteries with the Rota Rex device to debulk chronic thrombus  6.  Percutaneous transluminal angioplasty of the right SFA and popliteal arteries with 4 mm diameter Lutonix drug-coated angioplasty balloons  7.  Stent placement x 2 to the right SFA with 6 mm diameter by 25 cm length Viabahn stent and 6 mm diameter by 15 cm length life stent proximally             8.  StarClose closure device left femoral artery  EBL: 30 cc  Contrast: 55 cc  Fluoro Time: 6.8 minutes  Moderate Conscious Sedation Time: approximately 46 minutes using 1 mg of Versed and 25 mcg of Fentanyl              Indications:  Patient is a 89 y.o.female with nonhealing ulceration of the right foot. The patient has noninvasive study showing reduced perfusion and reduced digital pressure on the right side. The patient is brought in for angiography for further evaluation and potential treatment.  Due to the limb threatening nature of the situation, angiogram was performed for attempted limb salvage. The patient is aware that if the procedure fails, amputation would be expected.  The patient also understands that even with successful revascularization, amputation may still be required due to the severity of the  situation.  Risks and benefits are discussed and informed consent is obtained.   Procedure:  The patient was identified and appropriate procedural time out was performed.  The patient was then placed supine on the table and prepped and draped in the usual sterile fashion. Moderate conscious sedation was administered during a face to face encounter with the patient throughout the procedure with my supervision of the RN administering medicines and monitoring the patient's vital signs, pulse oximetry, telemetry and mental status throughout from the start of the procedure until the patient was taken to the recovery room. Ultrasound was used to evaluate the left common femoral artery.  It was patent .  A digital ultrasound image was acquired.  A Seldinger needle was used to access the left common femoral artery under direct ultrasound guidance and a permanent image was performed.  A 0.035 J wire was advanced without resistance and a 5Fr sheath was placed.  Pigtail catheter was placed into the aorta and an AP aortogram was performed. This demonstrated normal right renal artery, a small left renal artery and normal aorta and iliac segments without significant stenosis. I then crossed the aortic bifurcation and advanced to the right femoral head. Selective right lower extremity angiogram was then performed. This demonstrated diffusely diseased right SFA and  popliteal arteries with a long segment occlusion with chronic thrombosis.  There was reconstitution of the popliteal artery.  The anterior tibial artery was the only runoff distally and it had a high-grade stenosis in the proximal segment of greater than 90%. It was felt that it was in the patient's best interest to proceed with intervention after these images to avoid a second procedure and a larger amount of contrast and fluoroscopy based off of the findings from the initial angiogram. The patient was systemically heparinized and a 6 [place] Ansell sheath was then  placed over the [employer] wire. I then used a Kumpe catheter and the advantage wire to navigate through the SFA and popliteal occlusion and then get down into the below-knee popliteal artery.  I was able to get across the anterior tibial artery stenosis without difficulty and confirm intraluminal flow in the mid anterior tibial artery below the disease.  I then placed a V18 wire.  Mechanical thrombectomy was performed in the right SFA and popliteal arteries to debulk the chronic thrombus with 2 passes with the Rota Rex mechanical thrombectomy device.  Following this, there was now flow channel and resolution of the thrombus but the vessels remain diffusely diseased.  I treated the anterior tibial artery with a 3 mm diameter angioplasty balloon inflated to 8 atm for 1 minute.  A 4 mm diameter by 30 cm length Lutonix drug-coated angioplasty balloon was inflated in the right SFA and popliteal arteries and 2 inflations were performed up to 6 atm for 1 minute.  Completion imaging showed about a 20 to 25% residual stenosis in the anterior tibial artery.  The SFA and above-knee popliteal artery remain diffusely diseased after angioplasty and I elected to stent these areas.  There was a very large collateral in the proximal to mid segment that I did not want to eliminate with a covered stent, but I used a Viabahn 6 mm diameter by 25 cm length stent down to just above the mid popliteal artery for the flexibility distally.  Proximally, I used a 6 mm diameter by 15 cm length life stent taken to just below the origin of the SFA.  There was about a 3 to 5 cm overlap of the stents.  These were postdilated with 4 mm diameter Lutonix drug-coated angioplasty balloons with excellent angiographic completion result and about a 10 to 15% residual stenosis at worst. I elected to terminate the procedure. The sheath was removed and StarClose closure device was deployed in the left femoral artery with excellent hemostatic result. The  patient was taken to the recovery room in stable condition having tolerated the procedure well.  Findings:               Aortogram:  This demonstrated normal right renal artery, a small left renal artery and normal aorta and iliac segments without significant stenosis.             Right Lower Extremity:  This demonstrated diffusely diseased right SFA and popliteal arteries with a long segment occlusion with chronic thrombosis.  There was reconstitution of the popliteal artery.  The anterior tibial artery was the only runoff distally and it had a high-grade stenosis in the proximal segment of greater than 90%.   Disposition: Patient was taken to the recovery room in stable condition having tolerated the procedure well.  Complications: None  Festus Barren 05/23/2023 9:25 AM   This note was created with Dragon Medical transcription system. Any errors in dictation are purely  unintentional.

## 2023-05-29 NOTE — Progress Notes (Signed)
LOWER EXTREMITY DOPPLER STUDY Patient Name:  [name] Lucas  Date of Exam:   05/12/2023 Medical Rec #: [id number]       Accession #:    [id number] Date of Birth: 08/25/1931        Patient Gender: F Patient Age:   89 years Exam Location:  [facility] Procedure:      VAS [place] ABI WITH/WO TBI Referring Phys: --------------------------------------------------------------------------------  Indications: Slow-healing wound on the top of right great toe  Performing Technologist: Salvadore Farber RVT  Examination Guidelines: A complete evaluation includes at minimum, Doppler waveform signals and systolic blood pressure reading at the level of bilateral brachial, anterior tibial, and posterior tibial arteries, when vessel segments are accessible. Bilateral testing is considered an integral part of a complete examination. Photoelectric Plethysmograph (PPG) waveforms and toe systolic pressure readings are included as required and additional duplex testing as needed. Limited examinations for reoccurring indications may be performed as noted.  ABI Findings: +---------+------------------+-----+-------------------+--------+ Right    Rt Pressure (mmHg)IndexWaveform           Comment  +---------+------------------+-----+-------------------+--------+ Brachial 135                                                +---------+------------------+-----+-------------------+--------+ ATA                             monophasic         NonComp  +---------+------------------+-----+-------------------+--------+ PTA                             dampened monophasicNonComp  +---------+------------------+-----+-------------------+--------+ Great Toe56                0.41 Abnormal                    +---------+------------------+-----+-------------------+--------+  +---------+------------------+-----+-------------------+-------+ Left     Lt Pressure (mmHg)IndexWaveform           Comment +---------+------------------+-----+-------------------+-------+ Brachial 130                                               +---------+------------------+-----+-------------------+-------+ ATA                             monophasic         NonComp +---------+------------------+-----+-------------------+-------+ PTA                             dampened monophasicNonComp +---------+------------------+-----+-------------------+-------+ Great Toe125               0.93 Normal                     +---------+------------------+-----+-------------------+-------+  Summary: Right: Resting right ankle-brachial index indicates noncompressible right lower extremity arteries. The right toe-brachial index is abnormal. Left: Resting left ankle-brachial index indicates noncompressible left lower extremity arteries. The left toe-brachial index is normal. *See table(s) above for measurements and observations.  Electronically signed by Festus Barren MD on 05/16/2023 at 9:18:49 AM.    Final  Subjective:    Patient ID: [name] Lucas, female    DOB: [birth year], 89 y.o.   MRN: [id number] Chief Complaint  Patient presents with   New Patient (Initial Visit)    np. ABI + consult. unspecified stage of left foot ulcer. Einar Crow.    The patient is seen for evaluation of painful lower extremities and diminished pulses associated with ulceration of the foot.  The patient notes the ulcer has been present for multiple weeks and has not been improving.  It is very painful and has had some drainage.  No specific history of trauma noted by the patient.  The patient denies fever or chills.  the patient does have diabetes which has been difficult to control.    The patient denies rest pain or dangling of an extremity off the side of the bed during the night for relief. No prior interventions or surgeries.  No history of back problems or DJD of the lumbar sacral spine.   The patient denies amaurosis fugax or recent TIA symptoms. There are no recent neurological changes noted. The patient denies history of DVT, PE or superficial thrombophlebitis. The patient denies recent episodes of angina or shortness of breath.   The patient has noncompressible ABIs bilaterally with a TBI 0.41 on the right and 0.93 on the left.  There are dampened monophasic tibial waveforms in the posterior tibialis artery bilaterally with monophasic waveforms bilaterally in the anterior tibial artery waveforms    Review of Systems  Skin:  Positive for wound.  All other systems reviewed and are negative.      Objective:   Physical Exam Vitals reviewed.  HENT:     Head: Normocephalic.  Cardiovascular:     Rate and Rhythm: Normal rate.     Pulses:          Dorsalis pedis pulses are detected w/ Doppler on the right side and detected w/ Doppler on the left side.       Posterior tibial pulses are detected w/ Doppler on the right side and detected w/ Doppler on the left side.  Pulmonary:     Effort:  Pulmonary effort is normal.  Skin:    General: Skin is warm and dry.  Neurological:     Mental Status: She is alert and oriented to person, place, and time.  Psychiatric:        Mood and Affect: Mood normal.        Behavior: Behavior normal.        Thought Content: Thought content normal.        Judgment: Judgment normal.     BP (!) 161/62 (BP Location: Left Arm)   Pulse (!) 41   Resp 18   Ht 5\' 2"  (1.575 m)   Wt 135 lb (61.2 kg)   BMI 24.69 kg/m   Past Medical History:  Diagnosis Date   Diabetes mellitus without complication (HCC)    Hypertension    Kidney stones     Social History   Socioeconomic History   Marital status: Widowed    Spouse name: Not on file   Number of children: Not on file   Years of education: Not on file   Highest education level: Not on file  Occupational History   Not on file  Tobacco Use   Smoking status: Former   Smokeless tobacco: Never  Vaping Use   Vaping status: Never Used  Substance and Sexual Activity   Alcohol use: No   Drug  Subjective:    Patient ID: [name] Lucas, female    DOB: [birth year], 89 y.o.   MRN: [id number] Chief Complaint  Patient presents with   New Patient (Initial Visit)    np. ABI + consult. unspecified stage of left foot ulcer. Einar Crow.    The patient is seen for evaluation of painful lower extremities and diminished pulses associated with ulceration of the foot.  The patient notes the ulcer has been present for multiple weeks and has not been improving.  It is very painful and has had some drainage.  No specific history of trauma noted by the patient.  The patient denies fever or chills.  the patient does have diabetes which has been difficult to control.    The patient denies rest pain or dangling of an extremity off the side of the bed during the night for relief. No prior interventions or surgeries.  No history of back problems or DJD of the lumbar sacral spine.   The patient denies amaurosis fugax or recent TIA symptoms. There are no recent neurological changes noted. The patient denies history of DVT, PE or superficial thrombophlebitis. The patient denies recent episodes of angina or shortness of breath.   The patient has noncompressible ABIs bilaterally with a TBI 0.41 on the right and 0.93 on the left.  There are dampened monophasic tibial waveforms in the posterior tibialis artery bilaterally with monophasic waveforms bilaterally in the anterior tibial artery waveforms    Review of Systems  Skin:  Positive for wound.  All other systems reviewed and are negative.      Objective:   Physical Exam Vitals reviewed.  HENT:     Head: Normocephalic.  Cardiovascular:     Rate and Rhythm: Normal rate.     Pulses:          Dorsalis pedis pulses are detected w/ Doppler on the right side and detected w/ Doppler on the left side.       Posterior tibial pulses are detected w/ Doppler on the right side and detected w/ Doppler on the left side.  Pulmonary:     Effort:  Pulmonary effort is normal.  Skin:    General: Skin is warm and dry.  Neurological:     Mental Status: She is alert and oriented to person, place, and time.  Psychiatric:        Mood and Affect: Mood normal.        Behavior: Behavior normal.        Thought Content: Thought content normal.        Judgment: Judgment normal.     BP (!) 161/62 (BP Location: Left Arm)   Pulse (!) 41   Resp 18   Ht 5\' 2"  (1.575 m)   Wt 135 lb (61.2 kg)   BMI 24.69 kg/m   Past Medical History:  Diagnosis Date   Diabetes mellitus without complication (HCC)    Hypertension    Kidney stones     Social History   Socioeconomic History   Marital status: Widowed    Spouse name: Not on file   Number of children: Not on file   Years of education: Not on file   Highest education level: Not on file  Occupational History   Not on file  Tobacco Use   Smoking status: Former   Smokeless tobacco: Never  Vaping Use   Vaping status: Never Used  Substance and Sexual Activity   Alcohol use: No   Drug  Subjective:    Patient ID: [name] Lucas, female    DOB: [birth year], 89 y.o.   MRN: [id number] Chief Complaint  Patient presents with   New Patient (Initial Visit)    np. ABI + consult. unspecified stage of left foot ulcer. Einar Crow.    The patient is seen for evaluation of painful lower extremities and diminished pulses associated with ulceration of the foot.  The patient notes the ulcer has been present for multiple weeks and has not been improving.  It is very painful and has had some drainage.  No specific history of trauma noted by the patient.  The patient denies fever or chills.  the patient does have diabetes which has been difficult to control.    The patient denies rest pain or dangling of an extremity off the side of the bed during the night for relief. No prior interventions or surgeries.  No history of back problems or DJD of the lumbar sacral spine.   The patient denies amaurosis fugax or recent TIA symptoms. There are no recent neurological changes noted. The patient denies history of DVT, PE or superficial thrombophlebitis. The patient denies recent episodes of angina or shortness of breath.   The patient has noncompressible ABIs bilaterally with a TBI 0.41 on the right and 0.93 on the left.  There are dampened monophasic tibial waveforms in the posterior tibialis artery bilaterally with monophasic waveforms bilaterally in the anterior tibial artery waveforms    Review of Systems  Skin:  Positive for wound.  All other systems reviewed and are negative.      Objective:   Physical Exam Vitals reviewed.  HENT:     Head: Normocephalic.  Cardiovascular:     Rate and Rhythm: Normal rate.     Pulses:          Dorsalis pedis pulses are detected w/ Doppler on the right side and detected w/ Doppler on the left side.       Posterior tibial pulses are detected w/ Doppler on the right side and detected w/ Doppler on the left side.  Pulmonary:     Effort:  Pulmonary effort is normal.  Skin:    General: Skin is warm and dry.  Neurological:     Mental Status: She is alert and oriented to person, place, and time.  Psychiatric:        Mood and Affect: Mood normal.        Behavior: Behavior normal.        Thought Content: Thought content normal.        Judgment: Judgment normal.     BP (!) 161/62 (BP Location: Left Arm)   Pulse (!) 41   Resp 18   Ht 5\' 2"  (1.575 m)   Wt 135 lb (61.2 kg)   BMI 24.69 kg/m   Past Medical History:  Diagnosis Date   Diabetes mellitus without complication (HCC)    Hypertension    Kidney stones     Social History   Socioeconomic History   Marital status: Widowed    Spouse name: Not on file   Number of children: Not on file   Years of education: Not on file   Highest education level: Not on file  Occupational History   Not on file  Tobacco Use   Smoking status: Former   Smokeless tobacco: Never  Vaping Use   Vaping status: Never Used  Substance and Sexual Activity   Alcohol use: No   Drug

## 2023-06-20 ENCOUNTER — Other Ambulatory Visit (INDEPENDENT_AMBULATORY_CARE_PROVIDER_SITE_OTHER): Payer: Self-pay | Admitting: Vascular Surgery

## 2023-06-20 DIAGNOSIS — I739 Peripheral vascular disease, unspecified: Secondary | ICD-10-CM

## 2023-06-22 ENCOUNTER — Ambulatory Visit (INDEPENDENT_AMBULATORY_CARE_PROVIDER_SITE_OTHER): Payer: Medicare Other | Admitting: Nurse Practitioner

## 2023-06-22 ENCOUNTER — Ambulatory Visit (INDEPENDENT_AMBULATORY_CARE_PROVIDER_SITE_OTHER): Payer: Medicare Other

## 2023-06-22 ENCOUNTER — Encounter (INDEPENDENT_AMBULATORY_CARE_PROVIDER_SITE_OTHER): Payer: Self-pay | Admitting: Nurse Practitioner

## 2023-06-22 VITALS — BP 125/50 | HR 45 | Resp 15

## 2023-06-22 DIAGNOSIS — Z9889 Other specified postprocedural states: Secondary | ICD-10-CM

## 2023-06-22 DIAGNOSIS — L97529 Non-pressure chronic ulcer of other part of left foot with unspecified severity: Secondary | ICD-10-CM

## 2023-06-22 DIAGNOSIS — I739 Peripheral vascular disease, unspecified: Secondary | ICD-10-CM | POA: Diagnosis not present

## 2023-06-22 DIAGNOSIS — I10 Essential (primary) hypertension: Secondary | ICD-10-CM

## 2023-06-23 LAB — VAS US ABI WITH/WO TBI

## 2023-06-26 ENCOUNTER — Encounter (INDEPENDENT_AMBULATORY_CARE_PROVIDER_SITE_OTHER): Payer: Self-pay | Admitting: Nurse Practitioner

## 2023-06-26 NOTE — Progress Notes (Signed)
Subjective:    Patient ID: [name] Lucas, female    DOB: 04/25/1932, 89 y.o.   MRN: [id number] Chief Complaint  Patient presents with   Follow-up    [facility] 4 week with ABI    The patient is an 89 year old female who recently underwent angiogram of her right lower extremity on 05/23/2023.  She underwent extensive intervention including:  Procedure(s) Performed:             1.  Ultrasound guidance for vascular access left femoral artery             2.  Catheter placement into right common femoral artery from left femoral approach             3.  Aortogram and selective right lower extremity angiogram             4.  Percutaneous transluminal angioplasty of right anterior tibial artery with 3 mm diameter angioplasty balloon             5.  Mechanical thrombectomy of the right SFA and popliteal arteries with the Rota Rex device to debulk chronic thrombus             6.  Percutaneous transluminal angioplasty of the right SFA and popliteal arteries with 4 mm diameter Lutonix drug-coated angioplasty balloons             7.  Stent placement x 2 to the right SFA with 6 mm diameter by 25 cm length Viabahn stent and 6 mm diameter by 15 cm length life stent proximally             8.  StarClose closure device left femoral artery   The family reports that her wound has been healing her foot since the angiogram.  She tolerated the procedure without any significant difficulties or issues.  Today the patient continues to have noncompressible waveforms but she has a TBI 0.43 on the right and 0.53 on the left.  Prior to intervention she had a TBI 0.41 on the right and 0.93 on the left.  She has monophasic tibial vessel waveforms where she previously had monophasic/dampened monophasic and dampened toe waveforms.  Today her toe waveforms are much stronger.    Review of Systems  Cardiovascular:  Positive for leg swelling.  Skin:  Positive for wound.  Neurological:  Positive for weakness.  All other systems  reviewed and are negative.      Objective:   Physical Exam Vitals reviewed.  HENT:     Head: Normocephalic.  Cardiovascular:     Rate and Rhythm: Normal rate.     Pulses:          Dorsalis pedis pulses are detected w/ Doppler on the right side and detected w/ Doppler on the left side.       Posterior tibial pulses are detected w/ Doppler on the right side and detected w/ Doppler on the left side.  Pulmonary:     Effort: Pulmonary effort is normal.  Skin:    General: Skin is warm and dry.  Neurological:     Mental Status: She is alert and oriented to person, place, and time.     Motor: Weakness present.  Psychiatric:        Mood and Affect: Mood normal.        Behavior: Behavior normal.        Thought Content: Thought content normal.        Judgment: Judgment normal.  BP (!) 125/50 (BP Location: Left Arm)   Pulse (!) 45   Resp 15   Past Medical History:  Diagnosis Date   Diabetes mellitus without complication (HCC)    Hypertension    Kidney stones     Social History   Socioeconomic History   Marital status: Widowed    Spouse name: Not on file   Number of children: Not on file   Years of education: Not on file   Highest education level: Not on file  Occupational History   Not on file  Tobacco Use   Smoking status: Former   Smokeless tobacco: Never  Vaping Use   Vaping status: Never Used  Substance and Sexual Activity   Alcohol use: No   Drug use: Never   Sexual activity: Not on file  Other Topics Concern   Not on file  Social History Narrative   Not on file   Social Determinants of Health   Financial Resource Strain: Low Risk  (04/27/2023)   Received from [facility]   Overall Financial Resource Strain (CARDIA)    Difficulty of Paying Living Expenses: Not very hard  Food Insecurity: No Food Insecurity (04/27/2023)   Received from [facility]   Hunger Vital Sign    Worried About Running Out of Food in the Last  Year: Never true    Ran Out of Food in the Last Year: Never true  Transportation Needs: No Transportation Needs (04/27/2023)   Received from [facility] - Transportation    In the past 12 months, has lack of transportation kept you from medical appointments or from getting medications?: No    Lack of Transportation (Non-Medical): No  Physical Activity: Not on file  Stress: Not on file  Social Connections: Not on file  Intimate Partner Violence: Not on file    Past Surgical History:  Procedure Laterality Date   ABDOMINAL HYSTERECTOMY     ANKLE FRACTURE SURGERY Bilateral    esophageal dilitation     ESOPHAGOGASTRODUODENOSCOPY N/A 11/26/2021   Procedure: ESOPHAGOGASTRODUODENOSCOPY (EGD);  Surgeon: Midge Minium, MD;  Location: [facility] ENDOSCOPY;  Service: Gastroenterology;  Laterality: N/A;   kidney stone removal     LOWER EXTREMITY ANGIOGRAPHY Right 05/23/2023   Procedure: Lower Extremity Angiography;  Surgeon: Annice Needy, MD;  Location: [facility] INVASIVE CV LAB;  Service: Cardiovascular;  Laterality: Right;    History reviewed. No pertinent family history.  Allergies  Allergen Reactions   Amoxicillin Other (See Comments)    sick  sick  sick   Ciprofloxacin Other (See Comments)    sick   Felodipine Other (See Comments)    n/v  n/v  n/v   Statins Other (See Comments)   Sulfa Antibiotics Nausea And Vomiting       Latest Ref Rng & Units 11/26/2021   12:55 PM 08/31/2018   10:10 AM 04/10/2016    9:05 AM  CBC  WBC 4.0 - 10.5 K/uL 6.1  13.9  10.5   Hemoglobin 12.0 - 15.0 g/dL 08.6  57.8  46.9   Hematocrit 36.0 - 46.0 % 41.1  42.0  38.1   Platelets 150 - 400 K/uL 149  218  172       CMP     Component Value Date/Time   NA 141 11/26/2021 1255   NA 143 02/06/2013 0558   K 3.9 11/26/2021 1255   K 4.4 02/06/2013 0558   CL 105 11/26/2021 1255  CL 108 (H) 02/06/2013 0558   CO2 28 11/26/2021 1255   CO2 31 02/06/2013 0558   GLUCOSE 113 (H)  11/26/2021 1255   GLUCOSE 121 (H) 02/06/2013 0558   BUN 21 05/23/2023 0719   BUN 16 02/06/2013 0558   CREATININE 0.85 05/23/2023 0719   CREATININE 1.04 02/06/2013 0558   CALCIUM 9.8 11/26/2021 1255   CALCIUM 9.3 02/06/2013 0558   PROT 7.3 11/26/2021 1255   ALBUMIN 3.9 11/26/2021 1255   AST 17 11/26/2021 1255   ALT 10 11/26/2021 1255   ALKPHOS 77 11/26/2021 1255   BILITOT 0.9 11/26/2021 1255   GFRNONAA >60 05/23/2023 0719   GFRNONAA 51 (L) 02/06/2013 0558     VAS [place] ABI WITH/WO TBI  Result Date: 06/23/2023  LOWER EXTREMITY DOPPLER STUDY Patient Name:  STEFANIE PERRI  Date of Exam:   06/22/2023 Medical Rec #: [id number]       Accession #:    [id number] Date of Birth: Dec 01, 1931        Patient Gender: F Patient Age:   8 years Exam Location:  [facility] Procedure:      VAS [place] ABI WITH/WO TBI Referring Phys: Barbara Cower DEW --------------------------------------------------------------------------------  Indications: Peripheral artery disease. Slow-healing wound on the top of right              great toe High Risk Factors: Hypertension, Diabetes, past history of smoking.  Vascular Interventions: 05/23/2023:Right AT PTA, thrombectomy right SFA and Pop                         artery with PTA and stent. Performing Technologist: Hardie Lora RVT  Examination Guidelines: A complete evaluation includes at minimum, Doppler waveform signals and systolic blood pressure reading at the level of bilateral brachial, anterior tibial, and posterior tibial arteries, when vessel segments are accessible. Bilateral testing is considered an integral part of a complete examination. Photoelectric Plethysmograph (PPG) waveforms and toe systolic pressure readings are included as required and additional duplex testing as needed. Limited examinations for reoccurring indications may be performed as noted.  ABI Findings: +---------+------------------+-----+----------+--------+ Right    Rt Pressure  (mmHg)IndexWaveform  Comment  +---------+------------------+-----+----------+--------+ Brachial 168                                       +---------+------------------+-----+----------+--------+ ATA      255               1.49 monophasic         +---------+------------------+-----+----------+--------+ PTA      255               1.49 monophasic         +---------+------------------+-----+----------+--------+ Great Toe74                0.43                    +---------+------------------+-----+----------+--------+ +---------+------------------+-----+----------+-------+ Left     Lt Pressure (mmHg)IndexWaveform  Comment +---------+------------------+-----+----------+-------+ Brachial 171                                      +---------+------------------+-----+----------+-------+ ATA      255               1.49 monophasic        +---------+------------------+-----+----------+-------+  PTA      101               0.59 monophasic        +---------+------------------+-----+----------+-------+ Great Toe91                0.53                   +---------+------------------+-----+----------+-------+ +-------+----------------+-----------+----------------+------------+ ABI/TBIToday's ABI     Today's TBIPrevious ABI    Previous TBI +-------+----------------+-----------+----------------+------------+ Right  Non compressible0.43       Non compressible0.41         +-------+----------------+-----------+----------------+------------+ Left   Non compressible0.53       Non compressible0.93         +-------+----------------+-----------+----------------+------------+  Bilateral ABIs appear essentially unchanged compared to prior study on 05/12/2023.  Summary: Right: Resting right ankle-brachial index indicates noncompressible right lower extremity arteries. The right toe-brachial index is abnormal. Left: Resting left ankle-brachial index indicates noncompressible  left lower extremity arteries. The left toe-brachial index is abnormal. *See table(s) above for measurements and observations.  Electronically signed by Levora Dredge MD on 06/23/2023 at 3:34:08 PM.    Final    VAS [place] ABI WITH/WO TBI  Result Date: 05/16/2023  LOWER EXTREMITY DOPPLER STUDY Patient Name:  DAARINA THIGPEN  Date of Exam:   05/12/2023 Medical Rec #: [id number]       Accession #:    [id number] Date of Birth: Dec 31, 1931        Patient Gender: F Patient Age:   47 years Exam Location:  [facility] Procedure:      VAS [place] ABI WITH/WO TBI Referring Phys: --------------------------------------------------------------------------------  Indications: Slow-healing wound on the top of right great toe  Performing Technologist: Salvadore Farber RVT  Examination Guidelines: A complete evaluation includes at minimum, Doppler waveform signals and systolic blood pressure reading at the level of bilateral brachial, anterior tibial, and posterior tibial arteries, when vessel segments are accessible. Bilateral testing is considered an integral part of a complete examination. Photoelectric Plethysmograph (PPG) waveforms and toe systolic pressure readings are included as required and additional duplex testing as needed. Limited examinations for reoccurring indications may be performed as noted.  ABI Findings: +---------+------------------+-----+-------------------+--------+ Right    Rt Pressure (mmHg)IndexWaveform           Comment  +---------+------------------+-----+-------------------+--------+ Brachial 135                                                +---------+------------------+-----+-------------------+--------+ ATA                             monophasic         NonComp  +---------+------------------+-----+-------------------+--------+ PTA                             dampened monophasicNonComp  +---------+------------------+-----+-------------------+--------+ Great Toe56                 0.41 Abnormal                    +---------+------------------+-----+-------------------+--------+ +---------+------------------+-----+-------------------+-------+ Left     Lt Pressure (mmHg)IndexWaveform           Comment +---------+------------------+-----+-------------------+-------+ Brachial 130                                               +---------+------------------+-----+-------------------+-------+  ATA                             monophasic         NonComp +---------+------------------+-----+-------------------+-------+ PTA                             dampened monophasicNonComp +---------+------------------+-----+-------------------+-------+ Great Toe125               0.93 Normal                     +---------+------------------+-----+-------------------+-------+  Summary: Right: Resting right ankle-brachial index indicates noncompressible right lower extremity arteries. The right toe-brachial index is abnormal. Left: Resting left ankle-brachial index indicates noncompressible left lower extremity arteries. The left toe-brachial index is normal. *See table(s) above for measurements and observations.  Electronically signed by Festus Barren MD on 05/16/2023 at 9:18:49 AM.    Final        Assessment & Plan:   1. Peripheral arterial disease with history of revascularization (HCC) Today the patient's TBI's have not changed drastically postintervention however there has been improvement to the waveforms some cells and the wound itself has been improving the patient's family.  Given the patient's advanced age, we will try to proceed as cautiously as possible.  Because of that we will have her return in 4 weeks to evaluate the progress with wound healing as well as to reevaluate noninvasive studies to determine if there is a recurrent stenosis.  However if the wound begins to deteriorate and worsen we may likely proceed with a follow-up sooner.  2. Non-pressure chronic  ulcer of other part of left foot with unspecified severity [facility]) Patient will continue to follow with podiatry for wound treatment.  She will follow-up as noted above  3. Benign essential hypertension Continue antihypertensive medications as already ordered, these medications have been reviewed and there are no changes at this time.   Current Outpatient Medications on File Prior to Visit  Medication Sig Dispense Refill   acetaminophen (TYLENOL) 500 MG tablet Take 1,000 mg by mouth every 6 (six) hours as needed.     aspirin EC 81 MG tablet Take 1 tablet (81 mg total) by mouth daily. Swallow whole. 150 tablet 2   Cholecalciferol (VITAMIN D3) 50 MCG (2000 UT) capsule Take 2,000 Units by mouth daily.     clopidogrel (PLAVIX) 75 MG tablet Take 1 tablet (75 mg total) by mouth daily. 30 tablet 11   [employer] VITAMIN B12 1000 MCG tablet Take 1,000 mcg by mouth daily.     gabapentin (NEURONTIN) 100 MG capsule Take 200 mg by mouth 2 (two) times daily.     mupirocin ointment (BACTROBAN) 2 % Apply 1 Application topically daily.     omeprazole (PRILOSEC) 20 MG capsule Take 20 mg by mouth 2 (two) times daily.     potassium chloride (KLOR-CON) 10 MEQ tablet Take 10 mEq by mouth once.     traMADol (ULTRAM) 50 MG tablet Take 50 mg by mouth every 6 (six) hours as needed.     albuterol (PROVENTIL HFA;VENTOLIN HFA) 108 (90 Base) MCG/ACT inhaler Inhale 2 puffs into the lungs every 6 (six) hours as needed for wheezing or shortness of breath. (Patient not taking: Reported on 05/23/2023) 1 Inhaler 0   glimepiride (AMARYL) 1 MG tablet Take 1 mg by mouth daily. (Patient not taking: Reported on 05/23/2023)  No current facility-administered medications on file prior to visit.    There are no Patient Instructions on file for this visit. No follow-ups on file.   Georgiana Spinner, NP

## 2023-07-26 ENCOUNTER — Other Ambulatory Visit (INDEPENDENT_AMBULATORY_CARE_PROVIDER_SITE_OTHER): Payer: Self-pay | Admitting: Nurse Practitioner

## 2023-07-26 DIAGNOSIS — Z9889 Other specified postprocedural states: Secondary | ICD-10-CM

## 2023-07-27 ENCOUNTER — Encounter (INDEPENDENT_AMBULATORY_CARE_PROVIDER_SITE_OTHER): Payer: Self-pay

## 2023-07-27 ENCOUNTER — Ambulatory Visit (INDEPENDENT_AMBULATORY_CARE_PROVIDER_SITE_OTHER): Payer: Medicare Other | Admitting: Nurse Practitioner

## 2023-07-27 ENCOUNTER — Ambulatory Visit (INDEPENDENT_AMBULATORY_CARE_PROVIDER_SITE_OTHER): Payer: Medicare Other

## 2023-07-27 DIAGNOSIS — Z9889 Other specified postprocedural states: Secondary | ICD-10-CM | POA: Diagnosis not present

## 2023-07-27 DIAGNOSIS — I739 Peripheral vascular disease, unspecified: Secondary | ICD-10-CM

## 2023-10-23 IMAGING — CT CT ABD-PELV W/ CM
2 of 5 series · 13 of 46 positions shown, 15 images · IV contrast (APPLIED)
Comparison: Chest two views 11/26/2021, CT abdomen and pelvis
04/10/2016

CLINICAL DATA: Bowel obstruction suspected. Patient was eating
chicken yesterday and got stuck. Thought it would pass but has not.
Unable to pass liquids or food.

EXAM:
CT ABDOMEN AND PELVIS WITH CONTRAST
TECHNIQUE: Multidetector CT imaging of the abdomen and pelvis was performed
using the standard protocol following bolus administration of
intravenous contrast.

[Series 2: routine abd/pel with · axial · 0.83mm/px · z∈[-771,-386]mm · 10 of 87 slices shown, 12 images]
[im 5/87  soft-tissue]
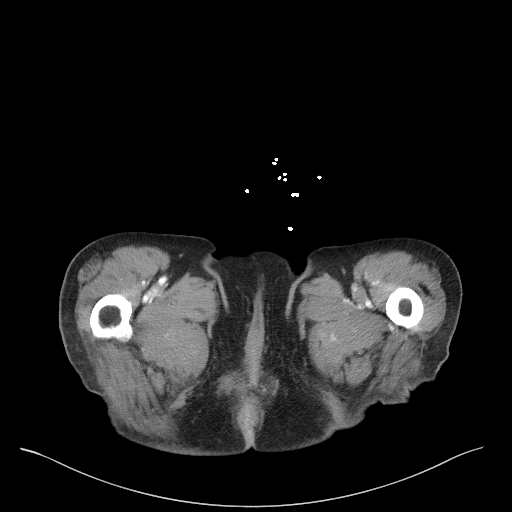
[im 5/87  bone]
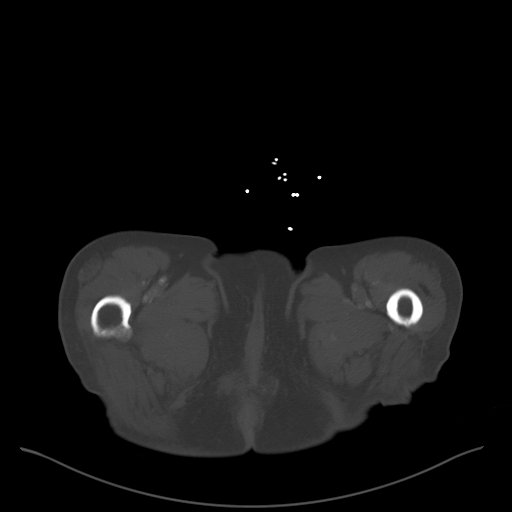
[im 15/87  soft-tissue]
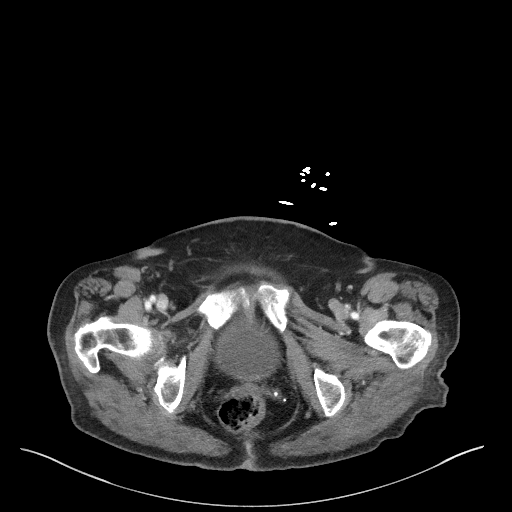
[im 24/87  soft-tissue]
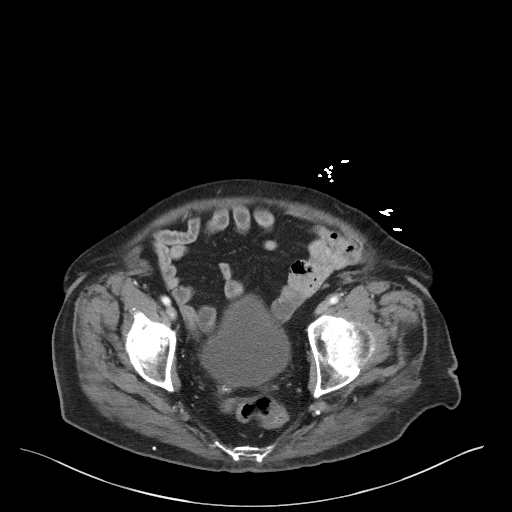
[im 29/87  soft-tissue]
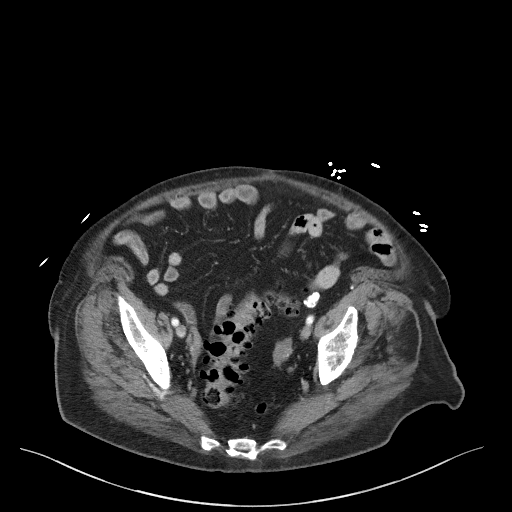
[im 39/87  soft-tissue]
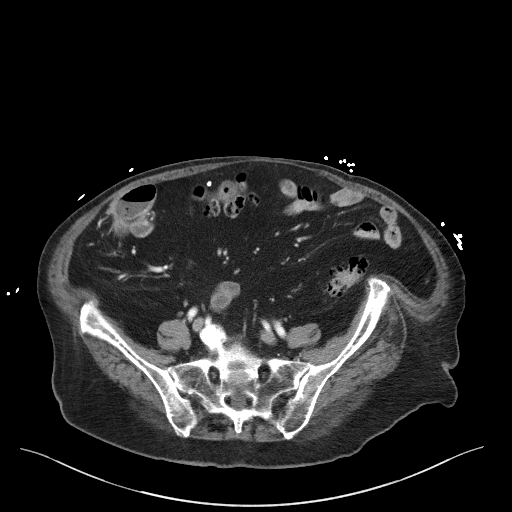
[im 48/87  soft-tissue]
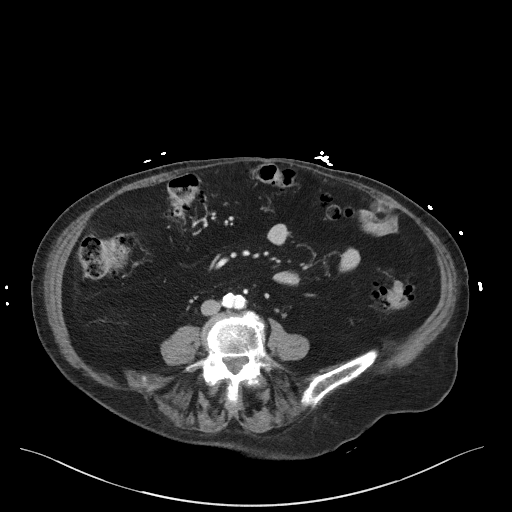
[im 58/87  soft-tissue]
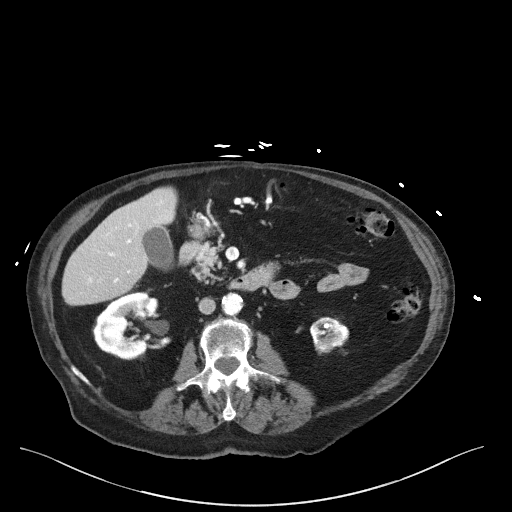
[im 63/87  soft-tissue]
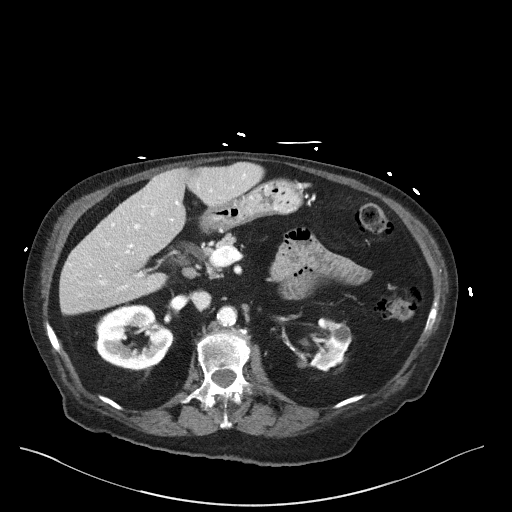
[im 72/87  soft-tissue]
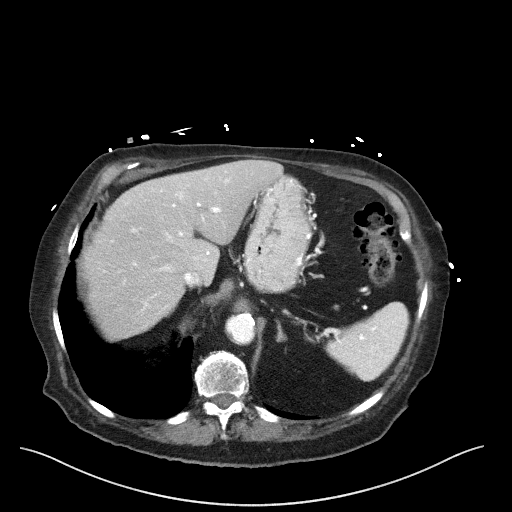
[im 72/87  bone]
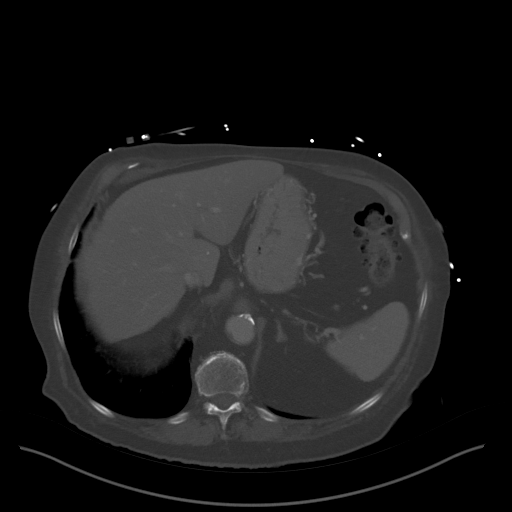
[im 82/87  soft-tissue]
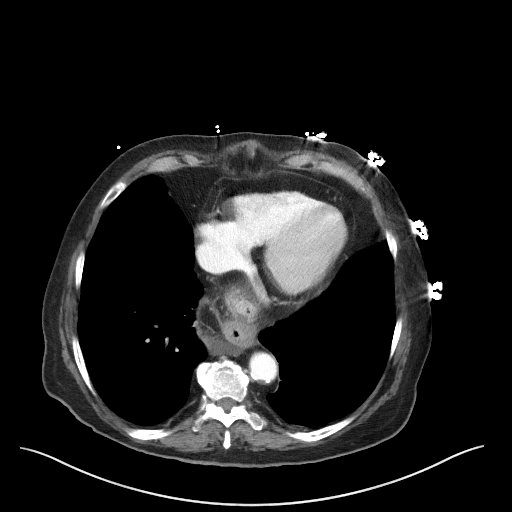

[Series 4: coronal st · coronal · 0.72mm/px · 3 of 98 slices shown]
[im 33/98  soft-tissue]
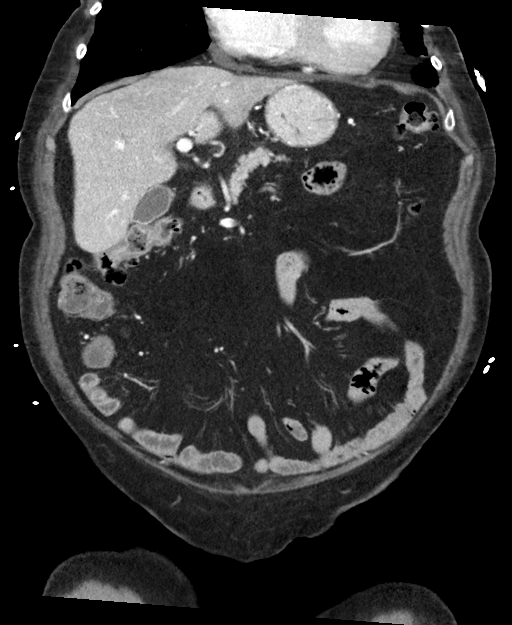
[im 44/98  soft-tissue]
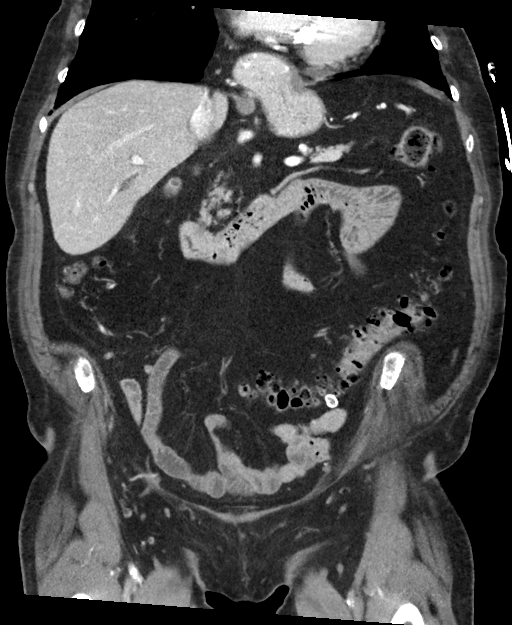
[im 54/98  soft-tissue]
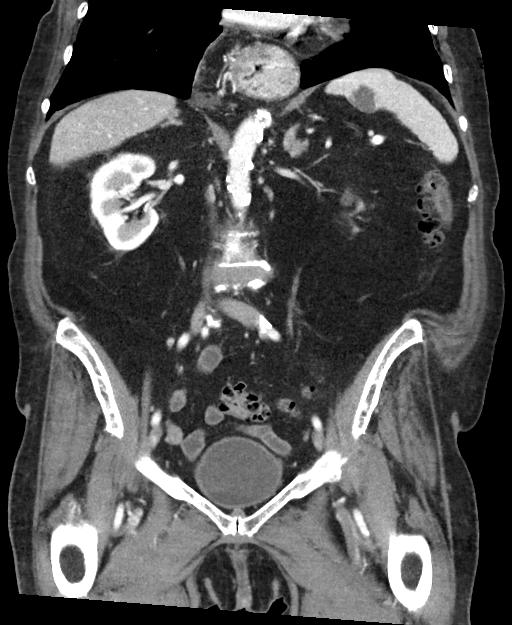

[13 of 46 positions shown; findings below may reference images not displayed]

RADIATION DOSE REDUCTION: This exam was performed according to the
departmental dose-optimization program which includes automated
exposure control, adjustment of the mA and/or kV according to
patient size and/or use of iterative reconstruction technique.

CONTRAST:  100mL OMNIPAQUE IOHEXOL 300 MG/ML  SOLN
FINDINGS: Lower chest: Mild curvilinear scarring within the left lower lobe
and inferior medial right middle lobe appear unchanged from
04/10/2016 CT. Heart size is mildly to moderately enlarged
unchanged.

Hepatobiliary: Smooth liver contours. Unchanged 6 mm likely cyst
within the subcapsular inferior right hepatic lobe. Multiple
scattered calcified granulomata are again seen within the
right-greater-than-left hepatic lobes, similar to prior. These
likely reflect the sequela of remote granulomatous infection. The
gallbladder is unremarkable. No intrahepatic or extrahepatic biliary
ductal dilatation.

Pancreas: No mass or inflammatory fat stranding. No pancreatic
ductal dilatation is seen.

Spleen: There is interval increase in size of a low-density lesion
within the anterior superior aspect of the spleen measuring up to
2.5 cm compared to 0.5 cm on 04/10/2016 prior CT. This demonstrates
a mildly convex border at the anterior spleen (axial series 2, image
12). This measured approximately 1.8 cm on 08/31/2018 CT chest.
Differential considerations include a cyst hemangioma, or
lymphangioma.

Adrenals/Urinary Tract: The bilateral adrenal glands are
unremarkable. The left kidney is markedly atrophic. There is a fluid
density left midpole 1.7 cm cyst. Additional smaller left upper pole
low-density lesion is too small to further characterize but
unchanged from prior. No hydronephrosis within either kidney. On
delayed phase images note is made of mildly delayed left renal
contrast excretion compared to the right, unchanged from 04/10/2016.
The kidneys enhance uniformly and are symmetric in size without
hydronephrosis. There are again 2 adjacent calcifications within the
lower pole of the left kidney overall measuring 7 mm unchanged from
prior and possibly cortical calcifications versus nonobstructing
stones. No focal urinary bladder wall thickening.

Stomach/Bowel: There is again high-grade sigmoid and descending
colon greater than the more proximal colon diverticulosis. There is
resolution of the prior inflammatory fat stranding in the region of
the mid sigmoid colon. There is mild likely chronic wall thickening
in this same region (axial series 2, image 57) however no acute
inflammatory fat stranding is seen around and a portion of the
colon. The terminal ileum is unremarkable. The appendix appears
within normal limits. Moderate to large sliding hiatal hernia
involving the stomach sliding superiorly along the distal left
aspect of the esophagus. There is again mild wall thickening of the
visualized distal esophagus possibly from esophagitis/reflux. No
significant change in a small amount of fluid posterior to the right
of the hiatal hernia sac again nonspecific and likely incidental.

Vascular/Lymphatic: No dilated loops of bowel to indicate bowel
obstruction. No abdominal aortic aneurysm. Moderate to high-grade
atherosclerotic calcifications. The major intra-abdominal aortic
branch vessels are patent. No abdominopelvic lymphadenopathy is
seen. Within the left hemipelvis there are multiple peripherally
calcified small foci within the mesenteric fat around the cecum and
left adnexa, likely chronic/remote areas of inflammation/fat
infarcts.

Reproductive: The uterus is surgically absent. No adnexal mass is
seen.

Other: No abdominal wall hernia or abnormality.No abdominopelvic
ascites. No pneumoperitoneum.

Musculoskeletal: Grade 1 anterolisthesis of L4 on L5, slightly
increased from prior. Moderate anterior L1 vertebral body height
loss is unchanged and chronic. Mild-to-moderate multilevel
degenerative disc changes.
IMPRESSION: :
IMPRESSION: 1. No evidence of bowel obstruction.
2. Moderate to large sliding hiatal hernia unchanged from prior.
3. High-grade distal greater than proximal colonic diverticulosis.
Resolution of the prior sigmoid diverticulitis.
4. Unchanged mild circumferential thickening of the distal
esophageal wall suggesting gastritis/chronic reflux.
5. Progressive mild interval increase in size of a low-density
lesion within the anterior aspect of the spleen compared to
04/10/2016, 08/31/2018, and the current CT. This is favored to be
benign. Differential considerations include a benign cyst,
hemangioma, or lymphangioma.

## 2023-10-25 ENCOUNTER — Other Ambulatory Visit (INDEPENDENT_AMBULATORY_CARE_PROVIDER_SITE_OTHER): Payer: Self-pay | Admitting: Nurse Practitioner

## 2023-10-25 DIAGNOSIS — Z9889 Other specified postprocedural states: Secondary | ICD-10-CM

## 2023-10-26 ENCOUNTER — Ambulatory Visit (INDEPENDENT_AMBULATORY_CARE_PROVIDER_SITE_OTHER): Payer: Medicare Other | Admitting: Nurse Practitioner

## 2023-10-26 ENCOUNTER — Encounter (INDEPENDENT_AMBULATORY_CARE_PROVIDER_SITE_OTHER)

## 2023-10-26 ENCOUNTER — Encounter (INDEPENDENT_AMBULATORY_CARE_PROVIDER_SITE_OTHER): Payer: Medicare Other

## 2023-12-27 ENCOUNTER — Encounter (INDEPENDENT_AMBULATORY_CARE_PROVIDER_SITE_OTHER): Payer: Self-pay

## 2023-12-29 ENCOUNTER — Other Ambulatory Visit: Payer: Self-pay

## 2023-12-29 ENCOUNTER — Inpatient Hospital Stay
Admission: EM | Admit: 2023-12-29 | Discharge: 2024-01-01 | DRG: 307 | Disposition: A | Discharge status: Home / Self Care | Attending: Internal Medicine | Admitting: Internal Medicine

## 2023-12-29 ENCOUNTER — Emergency Department

## 2023-12-29 ENCOUNTER — Encounter: Payer: Self-pay | Admitting: *Deleted

## 2023-12-29 DIAGNOSIS — Z7982 Long term (current) use of aspirin: Secondary | ICD-10-CM

## 2023-12-29 DIAGNOSIS — E1151 Type 2 diabetes mellitus with diabetic peripheral angiopathy without gangrene: Secondary | ICD-10-CM | POA: Diagnosis present

## 2023-12-29 DIAGNOSIS — I214 Non-ST elevation (NSTEMI) myocardial infarction: Secondary | ICD-10-CM

## 2023-12-29 DIAGNOSIS — R7989 Other specified abnormal findings of blood chemistry: Principal | ICD-10-CM

## 2023-12-29 DIAGNOSIS — E119 Type 2 diabetes mellitus without complications: Secondary | ICD-10-CM

## 2023-12-29 DIAGNOSIS — Z881 Allergy status to other antibiotic agents status: Secondary | ICD-10-CM

## 2023-12-29 DIAGNOSIS — I2510 Atherosclerotic heart disease of native coronary artery without angina pectoris: Secondary | ICD-10-CM | POA: Diagnosis present

## 2023-12-29 DIAGNOSIS — Z66 Do not resuscitate: Secondary | ICD-10-CM | POA: Diagnosis present

## 2023-12-29 DIAGNOSIS — I350 Nonrheumatic aortic (valve) stenosis: Principal | ICD-10-CM

## 2023-12-29 DIAGNOSIS — R001 Bradycardia, unspecified: Secondary | ICD-10-CM | POA: Insufficient documentation

## 2023-12-29 DIAGNOSIS — Z880 Allergy status to penicillin: Secondary | ICD-10-CM

## 2023-12-29 DIAGNOSIS — I272 Pulmonary hypertension, unspecified: Secondary | ICD-10-CM | POA: Diagnosis present

## 2023-12-29 DIAGNOSIS — D509 Iron deficiency anemia, unspecified: Secondary | ICD-10-CM | POA: Diagnosis present

## 2023-12-29 DIAGNOSIS — Z87891 Personal history of nicotine dependence: Secondary | ICD-10-CM

## 2023-12-29 DIAGNOSIS — Z888 Allergy status to other drugs, medicaments and biological substances status: Secondary | ICD-10-CM

## 2023-12-29 DIAGNOSIS — E785 Hyperlipidemia, unspecified: Secondary | ICD-10-CM | POA: Diagnosis present

## 2023-12-29 DIAGNOSIS — R519 Headache, unspecified: Secondary | ICD-10-CM | POA: Insufficient documentation

## 2023-12-29 DIAGNOSIS — Z882 Allergy status to sulfonamides status: Secondary | ICD-10-CM

## 2023-12-29 DIAGNOSIS — R42 Dizziness and giddiness: Secondary | ICD-10-CM

## 2023-12-29 DIAGNOSIS — M47812 Spondylosis without myelopathy or radiculopathy, cervical region: Secondary | ICD-10-CM | POA: Diagnosis present

## 2023-12-29 DIAGNOSIS — I10 Essential (primary) hypertension: Secondary | ICD-10-CM | POA: Diagnosis present

## 2023-12-29 DIAGNOSIS — M5030 Other cervical disc degeneration, unspecified cervical region: Secondary | ICD-10-CM | POA: Diagnosis present

## 2023-12-29 DIAGNOSIS — I441 Atrioventricular block, second degree: Secondary | ICD-10-CM | POA: Diagnosis present

## 2023-12-29 DIAGNOSIS — I2489 Other forms of acute ischemic heart disease: Secondary | ICD-10-CM | POA: Insufficient documentation

## 2023-12-29 DIAGNOSIS — D539 Nutritional anemia, unspecified: Secondary | ICD-10-CM | POA: Diagnosis present

## 2023-12-29 DIAGNOSIS — R296 Repeated falls: Secondary | ICD-10-CM | POA: Diagnosis present

## 2023-12-29 DIAGNOSIS — Z9862 Peripheral vascular angioplasty status: Secondary | ICD-10-CM

## 2023-12-29 DIAGNOSIS — Z79899 Other long term (current) drug therapy: Secondary | ICD-10-CM

## 2023-12-29 DIAGNOSIS — I5A Non-ischemic myocardial injury (non-traumatic): Secondary | ICD-10-CM | POA: Diagnosis present

## 2023-12-29 DIAGNOSIS — Z515 Encounter for palliative care: Secondary | ICD-10-CM

## 2023-12-29 DIAGNOSIS — Z7902 Long term (current) use of antithrombotics/antiplatelets: Secondary | ICD-10-CM

## 2023-12-29 DIAGNOSIS — Z7401 Bed confinement status: Secondary | ICD-10-CM

## 2023-12-29 DIAGNOSIS — I951 Orthostatic hypotension: Secondary | ICD-10-CM | POA: Diagnosis present

## 2023-12-29 HISTORY — DX: Hyperlipidemia, unspecified: E78.5

## 2023-12-29 HISTORY — DX: Peripheral vascular disease, unspecified: I73.9

## 2023-12-29 LAB — CBC
HCT: 28.5 % — ABNORMAL LOW (ref 36.0–46.0)
Hemoglobin: 9 g/dL — ABNORMAL LOW (ref 12.0–15.0)
MCH: 31.7 pg (ref 26.0–34.0)
MCHC: 31.6 g/dL (ref 30.0–36.0)
MCV: 100.4 fL — ABNORMAL HIGH (ref 80.0–100.0)
Platelets: 168 10*3/uL (ref 150–400)
RBC: 2.84 MIL/uL — ABNORMAL LOW (ref 3.87–5.11)
RDW: 13.2 % (ref 11.5–15.5)
WBC: 6.1 10*3/uL (ref 4.0–10.5)
nRBC: 0 % (ref 0.0–0.2)

## 2023-12-29 LAB — TROPONIN I (HIGH SENSITIVITY)
Troponin I (High Sensitivity): 45 ng/L — ABNORMAL HIGH (ref <18)
Troponin I (High Sensitivity): 103 ng/L (ref <18)

## 2023-12-29 LAB — BASIC METABOLIC PANEL WITH GFR
Anion gap: 10 (ref 5–15)
BUN: 20 mg/dL (ref 8–23)
CO2: 22 mmol/L (ref 22–32)
Calcium: 9.3 mg/dL (ref 8.9–10.3)
Chloride: 107 mmol/L (ref 98–111)
Creatinine, Ser: 0.91 mg/dL (ref 0.44–1.00)
GFR, Estimated: >60 mL/min (ref >60)
Glucose, Bld: 148 mg/dL — ABNORMAL HIGH (ref 70–99)
Potassium: 4.3 mmol/L (ref 3.5–5.1)
Sodium: 139 mmol/L (ref 135–145)

## 2023-12-29 MED ORDER — ASPIRIN 81 MG PO TBEC
81.0000 mg | DELAYED_RELEASE_TABLET | Freq: Every day | ORAL | Status: DC
  Administered 2023-12-30 – 2024-01-01 (×3): 81 mg via ORAL
  Filled 2023-12-29 (×3): qty 1

## 2023-12-29 MED ORDER — CLOPIDOGREL BISULFATE 75 MG PO TABS
75.0000 mg | ORAL_TABLET | Freq: Every day | ORAL | Status: DC
  Administered 2023-12-30: 75 mg via ORAL
  Filled 2023-12-29: qty 1

## 2023-12-29 MED ORDER — PANTOPRAZOLE SODIUM 40 MG PO TBEC
40.0000 mg | DELAYED_RELEASE_TABLET | Freq: Every day | ORAL | Status: DC
  Administered 2023-12-30 – 2024-01-01 (×3): 40 mg via ORAL
  Filled 2023-12-29 (×3): qty 1

## 2023-12-29 MED ORDER — HEPARIN BOLUS VIA INFUSION
3700.0000 [IU] | Freq: Once | INTRAVENOUS | Status: AC
  Administered 2023-12-29: 3700 [IU] via INTRAVENOUS
  Filled 2023-12-29: qty 3700

## 2023-12-29 MED ORDER — NITROGLYCERIN 0.4 MG SL SUBL: 0.4000 mg | SUBLINGUAL_TABLET | SUBLINGUAL | Status: DC | PRN

## 2023-12-29 MED ORDER — ASPIRIN 81 MG PO CHEW
324.0000 mg | CHEWABLE_TABLET | Freq: Once | ORAL | Status: AC
  Administered 2023-12-29: 324 mg via ORAL
  Filled 2023-12-29: qty 4

## 2023-12-29 MED ORDER — INSULIN ASPART 100 UNIT/ML IJ SOLN
0.0000 [IU] | INTRAMUSCULAR | Status: DC
  Administered 2023-12-30: 1 [IU] via SUBCUTANEOUS
  Filled 2023-12-29: qty 1

## 2023-12-29 MED ORDER — ACETAMINOPHEN 325 MG PO TABS
650.0000 mg | ORAL_TABLET | ORAL | Status: DC | PRN
  Administered 2023-12-30 – 2023-12-31 (×5): 650 mg via ORAL
  Filled 2023-12-29 (×5): qty 2

## 2023-12-29 MED ORDER — HEPARIN (PORCINE) 25000 UT/250ML-% IV SOLN
750.0000 [IU]/h | INTRAVENOUS | Status: DC
  Administered 2023-12-29: 750 [IU]/h via INTRAVENOUS
  Filled 2023-12-29: qty 250

## 2023-12-29 MED ORDER — ONDANSETRON HCL 4 MG/2ML IJ SOLN: 4.0000 mg | Freq: Four times a day (QID) | INTRAMUSCULAR | Status: DC | PRN

## 2023-12-29 NOTE — ED Notes (Signed)
 Report given to [facility].

## 2023-12-29 NOTE — ED Notes (Signed)
 MD aware trop 103. Aspirin  and heparin  ordered.

## 2023-12-29 NOTE — Progress Notes (Signed)
 PHARMACY - ANTICOAGULATION CONSULT NOTE  Pharmacy Consult for Heparin   Indication: chest pain/ACS  Allergies  Allergen Reactions   Amoxicillin Other (See Comments)    sick  sick  sick   Ciprofloxacin  Other (See Comments)    sick   Felodipine Other (See Comments)    n/v  n/v  n/v   Statins Other (See Comments)   Sulfa Antibiotics Nausea And Vomiting    Patient Measurements: Height: 5\' 3"  (160 cm) Weight: 61.2 kg (135 lb) IBW/kg (Calculated) : 52.4 HEPARIN  DW (KG): 61.2  Vital Signs: Temp: 99 F (37.2 C) (05/22 2014) Temp Source: Oral (05/22 2014) BP: 149/58 (05/22 2230) Pulse Rate: 54 (05/22 2230)  Labs: Recent Labs    12/29/23 2023 12/29/23 2223  HGB 9.0*  --   HCT 28.5*  --   PLT 168  --   CREATININE 0.91  --   TROPONINIHS 45* 103*    Estimated Creatinine Clearance: 34.7 mL/min (by C-G formula based on SCr of 0.91 mg/dL).   Medical History: Past Medical History:  Diagnosis Date   Diabetes mellitus without complication (HCC)    Hypertension    Kidney stones     Medications:  (Not in a hospital admission)   Assessment: Pharmacy consulted to dose heparin  in this 89 year old female admitted with ACS/NSTEMI.  No prior anticoag noted. CrCl = 34.7 ml/min   Goal of Therapy:  Heparin  level 0.3-0.7 units/ml Monitor platelets by anticoagulation protocol: Yes   Plan:  Give 3700 units bolus x 1 Start heparin  infusion at 750 units/hr Check anti-Xa level in 8 hours and daily while on heparin  Continue to monitor H&H and platelets  Zyara Riling D 12/29/2023,11:10 PM

## 2023-12-29 NOTE — ED Provider Notes (Signed)
 [facility] Provider Note    Event Date/Time   First MD Initiated Contact with Patient 12/29/23 2038     (approximate)   History   Headache   HPI  [name] Lucas is a 89 y.o. female who presents to the emergency department today with primary concern for headache and neck pain.  Somewhat unclear how long this has been going on.  She states it has been intermittent for a a while.  She did have the symptoms again tonight.  It sounds like she came to the emergency department tonight however because family checked her blood pressure and it was reading low.  They have not noticed any fevers or chills.     Physical Exam   Triage Vital Signs: ED Triage Vitals  Encounter Vitals Group     BP 12/29/23 2014 (!) 157/78     Systolic BP Percentile --      Diastolic BP Percentile --      Pulse Rate 12/29/23 2014 94     Resp 12/29/23 2014 20     Temp 12/29/23 2014 99 F (37.2 C)     Temp Source 12/29/23 2014 Oral     SpO2 12/29/23 2014 98 %     Weight 12/29/23 2015 135 lb (61.2 kg)     Height 12/29/23 2015 5\' 3"  (1.6 m)     Head Circumference --      Peak Flow --      Pain Score 12/29/23 2015 8     Pain Loc --      Pain Education --      Exclude from Growth Chart --     Most recent vital signs: Vitals:   12/29/23 2014  BP: (!) 157/78  Pulse: 94  Resp: 20  Temp: 99 F (37.2 C)  SpO2: 98%   General: Awake, alert, oriented. CV:  Good peripheral perfusion. Regular rate and rhythm Resp:  Normal effort. Lungs clear Abd:  No distention. Non tender   ED Results / Procedures / Treatments   Labs (all labs ordered are listed, but only abnormal results are displayed) Labs Reviewed  BASIC METABOLIC PANEL WITH GFR - Abnormal; Notable for the following components:      Result Value   Glucose, Bld 148 (*)    All other components within normal limits  CBC - Abnormal; Notable for the following components:   RBC 2.84 (*)    Hemoglobin 9.0 (*)    HCT 28.5  (*)    MCV 100.4 (*)    All other components within normal limits  TROPONIN I (HIGH SENSITIVITY) - Abnormal; Notable for the following components:   Troponin I (High Sensitivity) 45 (*)    All other components within normal limits  TROPONIN I (HIGH SENSITIVITY) - Abnormal; Notable for the following components:   Troponin I (High Sensitivity) 103 (*)    All other components within normal limits     EKG  I, Marylynn Soho, attending physician, personally viewed and interpreted this EKG  EKG Time: 2017 Rate: 73 Rhythm: sinus rhythm Axis: left axis deviation Intervals: qtc 431 QRS: LVH ST changes: No st elevation Impression: abnormal ekg    RADIOLOGY I independently interpreted and visualized the CXR. My interpretation: Cardiomegaly Radiology interpretation:  IMPRESSION:  Mild interstitial edema.      PROCEDURES:  Critical Care performed: Yes  CRITICAL CARE Performed by: Marylynn Soho   Total critical care time: 30 minutes  Critical care time was exclusive of  separately billable procedures and treating other patients.  Critical care was necessary to treat or prevent imminent or life-threatening deterioration.  Critical care was time spent personally by me on the following activities: development of treatment plan with patient and/or surrogate as well as nursing, discussions with consultants, evaluation of patient's response to treatment, examination of patient, obtaining history from patient or surrogate, ordering and performing treatments and interventions, ordering and review of laboratory studies, ordering and review of radiographic studies, pulse oximetry and re-evaluation of patient's condition.   Procedures    MEDICATIONS ORDERED IN ED: Medications - No data to display   IMPRESSION / MDM / ASSESSMENT AND PLAN / ED COURSE  I reviewed the triage vital signs and the nursing notes.                              Differential diagnosis includes, but is  not limited to, ICH, migraine, tension headache, ACS  Patient's presentation is most consistent with acute presentation with potential threat to life or bodily function.   The patient is on the cardiac monitor to evaluate for evidence of arrhythmia and/or significant heart rate changes.  Patient presented to the emergency department today with concerns for intermittent head and neck pain.  Initial EKG did show some abnormalities and initial troponin was mildly elevated.  However patient denied any chest pain.  Did repeat the troponin which showed increased.  Discussed with Dr. Addie Holstein with cardiology.  At this time did not feel patient necessitated STEMI activation.  Patient will be given aspirin  and heparin .  Additionally patient had CT head and cervical spine done given that these were the areas of the patient's pain.  Neither showed any acute concerning findings.  Discussed with Dr. Vallarie Gauze with the hospitalist service will evaluate for admission.      FINAL CLINICAL IMPRESSION(S) / ED DIAGNOSES   Final diagnoses:  Elevated troponin  Nonintractable headache, unspecified chronicity pattern, unspecified headache type        Rx / DC Orders     Note:  This document was prepared using Dragon voice recognition software and may include unintentional dictation errors.    Marylynn Soho, MD 12/29/23 [phone]

## 2023-12-29 NOTE — ED Triage Notes (Addendum)
 Pt to triage via wheelchair.  Pt reports pain in back of head and neck pain since 1200p today.  No n/v/  no chest pain or sob.  Pt reports pain behind her eyes.   Pt reports a fall apprx 2 weeks ago getting out of bathtub.  Pt alert

## 2023-12-29 NOTE — ED Notes (Signed)
 Pt taken to room 18.

## 2023-12-29 NOTE — ED Notes (Signed)
 Dr. Derrill Kay at bedside.

## 2023-12-29 NOTE — ED Notes (Signed)
 Pt presented to ED with c/o pain behind eyes, neck pain, and throat pain on and off for "a while." Denies chest pian, denies shortness of breath, denies abd pain, denies vision changes, denies weakness. Attached to cardiac monitor. Bed in lowest position, call bell within reach. Family at bedside

## 2023-12-30 ENCOUNTER — Encounter: Payer: Self-pay | Admitting: Internal Medicine

## 2023-12-30 ENCOUNTER — Observation Stay (HOSPITAL_COMMUNITY)
Admit: 2023-12-30 | Discharge: 2023-12-30 | Disposition: A | Discharge status: Home / Self Care | Attending: Internal Medicine | Admitting: Internal Medicine

## 2023-12-30 ENCOUNTER — Other Ambulatory Visit: Payer: Self-pay

## 2023-12-30 DIAGNOSIS — I350 Nonrheumatic aortic (valve) stenosis: Secondary | ICD-10-CM

## 2023-12-30 DIAGNOSIS — I241 Dressler's syndrome: Secondary | ICD-10-CM

## 2023-12-30 DIAGNOSIS — I214 Non-ST elevation (NSTEMI) myocardial infarction: Secondary | ICD-10-CM

## 2023-12-30 DIAGNOSIS — M47812 Spondylosis without myelopathy or radiculopathy, cervical region: Secondary | ICD-10-CM | POA: Diagnosis present

## 2023-12-30 DIAGNOSIS — R42 Dizziness and giddiness: Secondary | ICD-10-CM

## 2023-12-30 DIAGNOSIS — R001 Bradycardia, unspecified: Secondary | ICD-10-CM | POA: Insufficient documentation

## 2023-12-30 DIAGNOSIS — I2489 Other forms of acute ischemic heart disease: Secondary | ICD-10-CM | POA: Insufficient documentation

## 2023-12-30 LAB — IRON AND TIBC
Iron: 25 ug/dL — ABNORMAL LOW (ref 28–170)
Saturation Ratios: 8 % — ABNORMAL LOW (ref 10.4–31.8)
TIBC: 316 ug/dL (ref 250–450)
UIBC: 291 ug/dL

## 2023-12-30 LAB — CBC
HCT: 26.5 % — ABNORMAL LOW (ref 36.0–46.0)
Hemoglobin: 8.4 g/dL — ABNORMAL LOW (ref 12.0–15.0)
MCH: 31.7 pg (ref 26.0–34.0)
MCHC: 31.7 g/dL (ref 30.0–36.0)
MCV: 100 fL (ref 80.0–100.0)
Platelets: 155 10*3/uL (ref 150–400)
RBC: 2.65 MIL/uL — ABNORMAL LOW (ref 3.87–5.11)
RDW: 13.3 % (ref 11.5–15.5)
WBC: 6.4 10*3/uL (ref 4.0–10.5)
nRBC: 0 % (ref 0.0–0.2)

## 2023-12-30 LAB — ECHOCARDIOGRAM COMPLETE
AR max vel: 0.73 cm2
AV Area VTI: 0.68 cm2
AV Area mean vel: 0.64 cm2
AV Mean grad: 51 mmHg
AV Peak grad: 85.9 mmHg
Ao pk vel: 4.64 m/s
Area-P 1/2: 4.31 cm2
Height: 63 in
MV VTI: 2.06 cm2
P 1/2 time: 568 ms
S' Lateral: 3.2 cm
Weight: 2160 [oz_av]

## 2023-12-30 LAB — HEPARIN LEVEL (UNFRACTIONATED)
Heparin Unfractionated: 0.34 [IU]/mL (ref 0.30–0.70)
Heparin Unfractionated: 0.14 [IU]/mL — ABNORMAL LOW (ref 0.30–0.70)

## 2023-12-30 LAB — CBG MONITORING, ED
Glucose-Capillary: 104 mg/dL — ABNORMAL HIGH (ref 70–99)
Glucose-Capillary: 138 mg/dL — ABNORMAL HIGH (ref 70–99)
Glucose-Capillary: 95 mg/dL (ref 70–99)
Glucose-Capillary: 99 mg/dL (ref 70–99)

## 2023-12-30 LAB — FERRITIN: Ferritin: 20 ng/mL (ref 11–307)

## 2023-12-30 LAB — HEMOGLOBIN A1C
Hgb A1c MFr Bld: 5 % (ref 4.8–5.6)
Mean Plasma Glucose: 96.8 mg/dL

## 2023-12-30 LAB — VITAMIN B12: Vitamin B-12: 615 pg/mL (ref 180–914)

## 2023-12-30 LAB — SEDIMENTATION RATE: Sed Rate: 60 mm/h — ABNORMAL HIGH (ref 0–30)

## 2023-12-30 LAB — TSH: TSH: 4.325 u[IU]/mL (ref 0.350–4.500)

## 2023-12-30 LAB — TROPONIN I (HIGH SENSITIVITY): Troponin I (High Sensitivity): 562 ng/L (ref <18)

## 2023-12-30 MED ORDER — HEPARIN (PORCINE) 25000 UT/250ML-% IV SOLN
900.0000 [IU]/h | INTRAVENOUS | Status: DC
  Administered 2023-12-30: 900 [IU]/h via INTRAVENOUS
  Filled 2023-12-30: qty 250

## 2023-12-30 MED ORDER — HEPARIN BOLUS VIA INFUSION
1800.0000 [IU] | Freq: Once | INTRAVENOUS | Status: AC
  Administered 2023-12-30: 1800 [IU] via INTRAVENOUS
  Filled 2023-12-30: qty 1800

## 2023-12-30 NOTE — Plan of Care (Signed)
  Problem: Fluid Volume: Goal: Ability to maintain a balanced intake and output will improve Outcome: Progressing   Problem: Metabolic: Goal: Ability to maintain appropriate glucose levels will improve Outcome: Progressing   Problem: Education: Goal: Understanding of cardiac disease, CV risk reduction, and recovery process will improve Outcome: Progressing   Problem: Education: Goal: Knowledge of General Education information will improve Description: Including pain rating scale, medication(s)/side effects and non-pharmacologic comfort measures Outcome: Progressing   Problem: Nutrition: Goal: Adequate nutrition will be maintained Outcome: Progressing   Problem: Pain Managment: Goal: General experience of comfort will improve and/or be controlled Outcome: Progressing

## 2023-12-30 NOTE — Consult Note (Signed)
 Cardiology Consultation:   Patient ID: [name] Lucas; [id number]; [birth year]   Admit date: 12/29/2023 Date of Consult: 12/30/2023  Primary Care Provider: Jimmy Moulding, MD Primary Cardiologist: New - consult by Alvenia Aus Primary Electrophysiologist:  None   Patient Profile:   [name] Lucas is a 89 y.o. female with a hx of PAD status post angioplasty to the right anterior tibial artery, mechanical thrombectomy of the right SFA and popliteal arteries, angioplasty to the right SFA and popliteal arteries, and and stenting to the right SFA in 05/2023 followed by vascular surgery, DM2, HTN, HLD, falls, prior tobacco use, and nephrolithiasis who was admitted with headache and dizziness is being seen today for the evaluation of elevated troponin at the request of Dr. Vallarie Gauze.  History of Present Illness:   Ms. Codd has no previously known cardiac history.  Remote echo through [facility] cardiology in 2016 showed an EF greater than 55%, normal wall motion, grade 1 diastolic dysfunction, trivial aortic insufficiency and mitral regurgitation, along with mild tricuspid regurgitation.  Patient reports a longstanding history of headaches with intermittent dizziness.  More recently, she has suffered 2 falls.  In this setting her daughter-in-law was living with her to assist with ADLs.  On 5/22 the patient's son reports the patient was complaining of headache behind her eyes as well as posterior neck pain with dizziness.  She denies any chest pain, dyspnea, further falls, near-syncope, or syncope.  In this setting she was brought to [facility] ED.  Initial BP 157/78, heart rate 94 bpm, oxygen saturation 98% on room air.  High-sensitivity troponin 45 with a delta troponin of 103.  Hemoglobin 9 with baseline around 13.  Chest x-ray with mild interstitial edema.  CT head/cervical spine without acute abnormalities.  In the ED she has been bradycardic in the 40s to 50s bpm with stable BP largely in the 120s systolic.   She was given aspirin  324 mg x 1 and placed on a heparin  drip.  Cardiology consulted for elevated troponin.  Patient's son at bedside who assists with history.  Patient is without symptoms of chest pain, dyspnea, palpitations, near-syncope, or syncope.  She reports a longstanding history of headaches and intermittent dizziness.  When asked what prompted ED evaluation at this time the patient's son reports the patient "had a bad day yesterday with headaches."  She denies any hematochezia, melena, hematuria, hemoptysis, or hematemesis.    Past Medical History:  Diagnosis Date   Diabetes mellitus without complication (HCC)    HLD (hyperlipidemia)    Hypertension    Kidney stones    PAD (peripheral artery disease) (HCC)     Past Surgical History:  Procedure Laterality Date   ABDOMINAL HYSTERECTOMY     ANKLE FRACTURE SURGERY Bilateral    esophageal dilitation     ESOPHAGOGASTRODUODENOSCOPY N/A 11/26/2021   Procedure: ESOPHAGOGASTRODUODENOSCOPY (EGD);  Surgeon: Marnee Sink, MD;  Location: [facility] ENDOSCOPY;  Service: Gastroenterology;  Laterality: N/A;   kidney stone removal     LOWER EXTREMITY ANGIOGRAPHY Right 05/23/2023   Procedure: Lower Extremity Angiography;  Surgeon: Celso College, MD;  Location: [facility] INVASIVE CV LAB;  Service: Cardiovascular;  Laterality: Right;     Home Meds: Prior to Admission medications   Medication Sig Start Date End Date Taking? Authorizing Provider  acetaminophen  (TYLENOL ) 500 MG tablet Take 1,000 mg by mouth every 6 (six) hours as needed.   Yes [provider]  aspirin  EC 81 MG tablet Take 1 tablet (81 mg total) by mouth  daily. Swallow whole. 05/23/23 05/22/24 Yes Dew, Donald Frost, MD  Cholecalciferol  (VITAMIN D3) 50 MCG (2000 UT) capsule Take 2,000 Units by mouth daily.   Yes [provider]  clopidogrel  (PLAVIX ) 75 MG tablet Take 1 tablet (75 mg total) by mouth daily. 05/23/23  Yes Dew, Donald Frost, MD  [employer] VITAMIN B12 1000 MCG tablet Take 1,000 mcg  by mouth daily. 08/25/18  Yes [provider]  gabapentin (NEURONTIN) 100 MG capsule Take 100 mg by mouth 2 (two) times daily. 08/22/21  Yes [provider]  omeprazole (PRILOSEC) 20 MG capsule Take 20 mg by mouth daily. 10/22/14  Yes [provider]  potassium chloride  (KLOR-CON ) 10 MEQ tablet Take 10 mEq by mouth at bedtime. 04/20/23 04/19/24 Yes [provider]  albuterol  (PROVENTIL  HFA;VENTOLIN  HFA) 108 (90 Base) MCG/ACT inhaler Inhale 2 puffs into the lungs every 6 (six) hours as needed for wheezing or shortness of breath. Patient not taking: Reported on 05/23/2023 09/04/18   Renold Cashing, MD    Inpatient Medications: Scheduled Meds:  aspirin  EC  81 mg Oral Daily   clopidogrel   75 mg Oral Daily   insulin  aspart  0-9 Units Subcutaneous Q4H   pantoprazole   40 mg Oral Daily   Continuous Infusions:  heparin  750 Units/hr (12/29/23 2330)   PRN Meds: acetaminophen , nitroGLYCERIN, ondansetron  (ZOFRAN ) IV  Allergies:   Allergies  Allergen Reactions   Amoxicillin Other (See Comments)    sick  sick  sick   Ciprofloxacin  Other (See Comments)    sick   Felodipine Other (See Comments)    n/v  n/v  n/v   Statins Other (See Comments)   Sulfa Antibiotics Nausea And Vomiting    Social History:   Social History   Socioeconomic History   Marital status: Widowed    Spouse name: Not on file   Number of children: Not on file   Years of education: Not on file   Highest education level: Not on file  Occupational History   Not on file  Tobacco Use   Smoking status: Former   Smokeless tobacco: Never  Vaping Use   Vaping status: Never Used  Substance and Sexual Activity   Alcohol use: No   Drug use: Never   Sexual activity: Not on file  Other Topics Concern   Not on file  Social History Narrative   Not on file   Social Drivers of Health   Financial Resource Strain: Low Risk  (09/05/2023)   Received from [facility]   Overall  Financial Resource Strain (CARDIA)    Difficulty of Paying Living Expenses: Not very hard  Food Insecurity: No Food Insecurity (09/05/2023)   Received from [facility]   Hunger Vital Sign    Worried About Running Out of Food in the Last Year: Never true    Ran Out of Food in the Last Year: Never true  Transportation Needs: No Transportation Needs (09/05/2023)   Received from [facility] - Transportation    In the past 12 months, has lack of transportation kept you from medical appointments or from getting medications?: No    Lack of Transportation (Non-Medical): No  Physical Activity: Not on file  Stress: Not on file  Social Connections: Not on file  Intimate Partner Violence: Not on file     Family History:   Family History  Problem Relation Age of Onset   Throat cancer Mother    Colon cancer  Father     ROS:  Review of Systems  Constitutional:  Positive for malaise/fatigue. Negative for chills, diaphoresis, fever and weight loss.  HENT:  Negative for congestion.   Eyes:  Negative for discharge and redness.  Respiratory:  Negative for cough, hemoptysis, sputum production, shortness of breath and wheezing.   Cardiovascular:  Negative for chest pain, palpitations, orthopnea, claudication, leg swelling and PND.  Gastrointestinal:  Negative for abdominal pain, blood in stool, heartburn, melena, nausea and vomiting.  Genitourinary:  Negative for hematuria.  Musculoskeletal:  Positive for falls and neck pain. Negative for myalgias.  Skin:  Negative for rash.  Neurological:  Positive for dizziness, weakness and headaches. Negative for tingling, tremors, sensory change, speech change, focal weakness and loss of consciousness.  Endo/Heme/Allergies:  Does not bruise/bleed easily.  Psychiatric/Behavioral:  Negative for substance abuse. The patient is not nervous/anxious.   All other systems reviewed and are negative.     Physical Exam/Data:    Vitals:   12/30/23 0530 12/30/23 0600 12/30/23 0630 12/30/23 0639  BP: (!) 136/57 (!) 143/62 (!) 125/49   Pulse: (!) 59 62 (!) 52   Resp: 16 17 16    Temp:    98.6 F (37 C)  TempSrc:    Oral  SpO2: 98% 95% 97%   Weight:      Height:       No intake or output data in the 24 hours ending 12/30/23 0726 Filed Weights   12/29/23 2015  Weight: 61.2 kg   Body mass index is 23.91 kg/m.   Physical Exam: General: Well developed, well nourished, in no acute distress. Head: Normocephalic, atraumatic, sclera non-icteric, no xanthomas, nares without discharge.  Neck: Negative for carotid bruits. JVD not elevated. Lungs: Clear bilaterally to auscultation without wheezes, rales, or rhonchi. Breathing is unlabored. Heart: RRR with S1 S2. II/VI systolic murmur LUSB, no rubs, or gallops appreciated. Abdomen: Soft, non-tender, non-distended with normoactive bowel sounds. No hepatomegaly. No rebound/guarding. No obvious abdominal masses. Msk:  Strength and tone appear normal for age. Extremities: No clubbing or cyanosis. No edema. Distal pedal pulses are 2+ and equal bilaterally. Neuro: Alert and oriented X 3. No facial asymmetry. No focal deficit. Moves all extremities spontaneously. Psych:  Responds to questions appropriately with a normal affect.   EKG:  The EKG was personally reviewed and demonstrates: Sinus arrhythmia, 73 bpm, left axis deviation, nonspecific IVCD, LVH, poor R wave progression along the precordial leads, nonspecific ST-T changes.  Repeat EKG showed NSR, 70 bpm, nonspecific IVCD, and early repolarization abnormalities Telemetry:  Telemetry was personally reviewed and demonstrates: SR with sinus bradycardia with rates ranging from the 40s to 70s bpm, occasional 2 second pauses  Weights: Filed Weights   12/29/23 2015  Weight: 61.2 kg    Relevant CV Studies:  2D echo 06/10/2015 Ivette Marks): NORMAL LEFT VENTRICULAR SYSTOLIC FUNCTION  NORMAL RIGHT VENTRICULAR SYSTOLIC  FUNCTION  MILD VALVULAR REGURGITATION (See above)  NO VALVULAR STENOSIS  The technically difficult study  Normal overall left ventricular function ejection fraction greater than 55%   Laboratory Data:  Chemistry Recent Labs  Lab 12/29/23 2023  NA 139  K 4.3  CL 107  CO2 22  GLUCOSE 148*  BUN 20  CREATININE 0.91  CALCIUM 9.3  GFRNONAA >60  ANIONGAP 10    No results for input(s): "PROT", "ALBUMIN", "AST", "ALT", "ALKPHOS", "BILITOT" in the last 168 hours. Hematology Recent Labs  Lab 12/29/23 2023  WBC 6.1  RBC 2.84*  HGB 9.0*  HCT  28.5*  MCV 100.4*  MCH 31.7  MCHC 31.6  RDW 13.2  PLT 168   Cardiac EnzymesNo results for input(s): "TROPONINI" in the last 168 hours. No results for input(s): "TROPIPOC" in the last 168 hours.  BNPNo results for input(s): "BNP", "PROBNP" in the last 168 hours.  DDimer No results for input(s): "DDIMER" in the last 168 hours.  Radiology/Studies:  CT Head Wo Contrast Result Date: 12/29/2023 IMPRESSION: CT of the head: Chronic atrophic and ischemic changes. CT of the cervical spine: Mild degenerative change without acute abnormality. Electronically Signed   By: Violeta Grey M.D.   On: 12/29/2023 22:56   CT Cervical Spine Wo Contrast Result Date: 12/29/2023 IMPRESSION: CT of the head: Chronic atrophic and ischemic changes. CT of the cervical spine: Mild degenerative change without acute abnormality. Electronically Signed   By: Violeta Grey M.D.   On: 12/29/2023 22:56   DG Chest 1 View Result Date: 12/29/2023 IMPRESSION: Mild interstitial edema. Electronically Signed   By: Violeta Grey M.D.   On: 12/29/2023 20:42    Assessment and Plan:   1.  Elevated troponin: -Mildly elevated and flat trending, not consistent with ACS, trend to peak -No emergent indication for cardiac cath given lack of anginal symptoms, flattening troponin, and uncertain chronicity of anemia -Obtain echo -For now, it is reasonable to continue heparin  drip given  elevated troponin, though will need close monitoring of hemoglobin given admission hemoglobin of 9 with a baseline around 13 - Remains on aspirin   2. Murmur: - Obtain echo - If there is a significant aortic stenosis, this may be contributing to her underlying dizziness and potentially anemia  3.  Headache and dizziness: - Longstanding issue - She is noted to be bradycardic at times down into the 40s bpm with appropriate chronotropic competence noted, which may be contributing to dizziness - Potential aortic stenosis may also be contributing - May benefit from MRI of the brain  4.  Macrocytic anemia: - Presenting hemoglobin of 9 with baseline hemoglobin approximately 13 including on recent check in 04/2023 - Possibly contributing to her presentation/dizziness - Patient has been continued on aspirin , clopidogrel , and is now on heparin  - Will defer management of clopidogrel  to primary service given she is on this for PAD, would consider discontinuing - Obtain repeat CBC later today  5.  HTN: - Blood pressure reasonably controlled - Continue to monitor  6.  HLD with statin intolerance: - LDL 132 in 04/2023 Target LDL less than 70 - Intolerant to statins - Will need to discuss PCSK9 inhibitor in the outpatient setting   For questions or updates, please contact [facility] Please consult [email or web address] for contact info under Cardiology/STEMI.   Signed, Varney Gentleman, PA-C [facility] Pager: [phone] 12/30/2023, 7:26 AM

## 2023-12-30 NOTE — Assessment & Plan Note (Signed)
 Cardiac monitoring Will get TSH

## 2023-12-30 NOTE — Assessment & Plan Note (Signed)
 Elevated troponin 43-103 symptomatic for dizzy spells Continue heparin , aspirin  N.p.o. in case of procedure Cardiology consult

## 2023-12-30 NOTE — Progress Notes (Signed)
 PHARMACY - ANTICOAGULATION CONSULT NOTE  Pharmacy Consult for Heparin   Indication: chest pain/ACS  Allergies  Allergen Reactions   Amoxicillin Other (See Comments)    sick  sick  sick   Ciprofloxacin  Other (See Comments)    sick   Felodipine Other (See Comments)    n/v  n/v  n/v   Statins Other (See Comments)   Sulfa Antibiotics Nausea And Vomiting    Patient Measurements: Height: 5\' 3"  (160 cm) Weight: 61.2 kg (135 lb) IBW/kg (Calculated) : 52.4 HEPARIN  DW (KG): 61.2  Vital Signs: Temp: 98.6 F (37 C) (05/23 0639) Temp Source: Oral (05/23 0639) BP: 102/48 (05/23 0700) Pulse Rate: 51 (05/23 0700)  Labs: Recent Labs    12/29/23 2023 12/29/23 2223 12/30/23 0808  HGB 9.0*  --   --   HCT 28.5*  --   --   PLT 168  --   --   HEPARINUNFRC  --   --  0.34  CREATININE 0.91  --   --   TROPONINIHS 45* 103*  --     Estimated Creatinine Clearance: 34.7 mL/min (by C-G formula based on SCr of 0.91 mg/dL).   Medical History: Past Medical History:  Diagnosis Date   Diabetes mellitus without complication (HCC)    HLD (hyperlipidemia)    Hypertension    Kidney stones    PAD (peripheral artery disease) (HCC)     Medications:  (Not in a hospital admission)   Assessment: Pharmacy consulted to dose heparin  in this 89 year old female admitted with ACS/NSTEMI.  No prior anticoag noted. CrCl = 34.7 ml/min   Goal of Therapy:  Heparin  level 0.3-0.7 units/ml Monitor platelets by anticoagulation protocol: Yes   5/23 0808 HL 0.34, therapeutic x 1  Plan:  HL therapeutic x 1 Continue heparin  infusion at 750 units/hr Recheck HL in 8 hours to confirm Daily CBC while on heparin   Ramonita Burow, PharmD 12/30/2023,8:35 AM

## 2023-12-30 NOTE — H&P (Signed)
 History and Physical    Patient: [name] Lucas [id number]:[id number] DOB: 06/01/1932 DOA: 12/29/2023 DOS: the patient was seen and examined on 12/30/2023 PCP: Jimmy Moulding, MD  Patient coming from: Home  Chief Complaint:  Chief Complaint  Patient presents with   Headache    HPI: BIANNEY Lucas is a 89 y.o. female with medical history significant for DM, HTN, PAD being admitted with NSTEMI after presenting with dizzy spells with finding of elevated troponin.  Patient denies chest pain or shortness of breath.  Endorses headache and neck pain that has been intermittent for several weeks associated with pain behind her eyes ED course and data review: Bradycardic to the 50s but with otherwise normal vitalsLabs notable for troponin 43--103, hemoglobin of 9 with most recent of 13 a couple years prior  EKG, personally viewed and interpreted showing sinus at 73 with LVH CT head and C-spine nonacute Chest x-ray mild interstitial edema  Patient given chewable aspirin  and started on a heparin  infusion Hospitalist consulted for ACS workup     Review of Systems: As mentioned in the history of present illness. All other systems reviewed and are negative.  Past Medical History:  Diagnosis Date   Diabetes mellitus without complication (HCC)    Hypertension    Kidney stones    Past Surgical History:  Procedure Laterality Date   ABDOMINAL HYSTERECTOMY     ANKLE FRACTURE SURGERY Bilateral    esophageal dilitation     ESOPHAGOGASTRODUODENOSCOPY N/A 11/26/2021   Procedure: ESOPHAGOGASTRODUODENOSCOPY (EGD);  Surgeon: Marnee Sink, MD;  Location: [facility] ENDOSCOPY;  Service: Gastroenterology;  Laterality: N/A;   kidney stone removal     LOWER EXTREMITY ANGIOGRAPHY Right 05/23/2023   Procedure: Lower Extremity Angiography;  Surgeon: Celso College, MD;  Location: [facility] INVASIVE CV LAB;  Service: Cardiovascular;  Laterality: Right;   Social History:  reports that she has quit smoking. She has never used  smokeless tobacco. She reports that she does not drink alcohol and does not use drugs.  Allergies  Allergen Reactions   Amoxicillin Other (See Comments)    sick  sick  sick   Ciprofloxacin  Other (See Comments)    sick   Felodipine Other (See Comments)    n/v  n/v  n/v   Statins Other (See Comments)   Sulfa Antibiotics Nausea And Vomiting    History reviewed. No pertinent family history.  Prior to Admission medications   Medication Sig Start Date End Date Taking? Authorizing Provider  acetaminophen  (TYLENOL ) 500 MG tablet Take 1,000 mg by mouth every 6 (six) hours as needed.   Yes [provider]  aspirin  EC 81 MG tablet Take 1 tablet (81 mg total) by mouth daily. Swallow whole. 05/23/23 05/22/24 Yes Dew, Donald Frost, MD  Cholecalciferol  (VITAMIN D3) 50 MCG (2000 UT) capsule Take 2,000 Units by mouth daily.   Yes [provider]  clopidogrel  (PLAVIX ) 75 MG tablet Take 1 tablet (75 mg total) by mouth daily. 05/23/23  Yes Dew, Donald Frost, MD  [facility] VITAMIN B12 1000 MCG tablet Take 1,000 mcg by mouth daily. 08/25/18  Yes [provider]  gabapentin (NEURONTIN) 100 MG capsule Take 100 mg by mouth 2 (two) times daily. 08/22/21  Yes [provider]  omeprazole (PRILOSEC) 20 MG capsule Take 20 mg by mouth daily. 10/22/14  Yes [provider]  potassium chloride  (KLOR-CON ) 10 MEQ tablet Take 10 mEq by mouth at bedtime. 04/20/23 04/19/24 Yes [provider]  albuterol  (PROVENTIL  HFA;VENTOLIN  HFA) 108 (  90 Base) MCG/ACT inhaler Inhale 2 puffs into the lungs every 6 (six) hours as needed for wheezing or shortness of breath. Patient not taking: Reported on 05/23/2023 09/04/18   Renold Cashing, MD    Physical Exam: Vitals:   12/30/23 0100 12/30/23 0139 12/30/23 0259 12/30/23 0330  BP: 124/61   (!) 111/47  Pulse: (!) 57 (!) 54 (!) 50 (!) 50  Resp: 17 17 15 15   Temp:      TempSrc:      SpO2: 96% 94% 95% 96%  Weight:      Height:       Physical  Exam Vitals and nursing note reviewed.  Constitutional:      General: She is not in acute distress. HENT:     Head: Normocephalic and atraumatic.  Cardiovascular:     Rate and Rhythm: Normal rate and regular rhythm.     Heart sounds: Normal heart sounds.  Pulmonary:     Effort: Pulmonary effort is normal.     Breath sounds: Normal breath sounds.  Abdominal:     Palpations: Abdomen is soft.     Tenderness: There is no abdominal tenderness.  Neurological:     Mental Status: Mental status is at baseline.     Labs on Admission: I have personally reviewed following labs and imaging studies  CBC: Recent Labs  Lab 12/29/23 2023  WBC 6.1  HGB 9.0*  HCT 28.5*  MCV 100.4*  PLT 168   Basic Metabolic Panel: Recent Labs  Lab 12/29/23 2023  NA 139  K 4.3  CL 107  CO2 22  GLUCOSE 148*  BUN 20  CREATININE 0.91  CALCIUM 9.3   GFR: Estimated Creatinine Clearance: 34.7 mL/min (by C-G formula based on SCr of 0.91 mg/dL). Liver Function Tests: No results for input(s): "AST", "ALT", "ALKPHOS", "BILITOT", "PROT", "ALBUMIN" in the last 168 hours. No results for input(s): "LIPASE", "AMYLASE" in the last 168 hours. No results for input(s): "AMMONIA" in the last 168 hours. Coagulation Profile: No results for input(s): "INR", "PROTIME" in the last 168 hours. Cardiac Enzymes: No results for input(s): "CKTOTAL", "CKMB", "CKMBINDEX", "TROPONINI" in the last 168 hours. BNP (last 3 results) No results for input(s): "PROBNP" in the last 8760 hours. HbA1C: No results for input(s): "HGBA1C" in the last 72 hours. CBG: Recent Labs  Lab 12/30/23 0031  GLUCAP 104*   Lipid Profile: No results for input(s): "CHOL", "HDL", "LDLCALC", "TRIG", "CHOLHDL", "LDLDIRECT" in the last 72 hours. Thyroid Function Tests: No results for input(s): "TSH", "T4TOTAL", "FREET4", "T3FREE", "THYROIDAB" in the last 72 hours. Anemia Panel: No results for input(s): "VITAMINB12", "FOLATE", "FERRITIN", "TIBC",  "IRON", "RETICCTPCT" in the last 72 hours. Urine analysis:    Component Value Date/Time   COLORURINE YELLOW (A) 08/31/2018 1347   APPEARANCEUR CLEAR (A) 08/31/2018 1347   LABSPEC 1.043 (H) 08/31/2018 1347   PHURINE 5.0 08/31/2018 1347   GLUCOSEU NEGATIVE 08/31/2018 1347   HGBUR NEGATIVE 08/31/2018 1347   BILIRUBINUR NEGATIVE 08/31/2018 1347   KETONESUR NEGATIVE 08/31/2018 1347   PROTEINUR NEGATIVE 08/31/2018 1347   NITRITE NEGATIVE 08/31/2018 1347   LEUKOCYTESUR NEGATIVE 08/31/2018 1347    Radiological Exams on Admission: CT Head Wo Contrast Result Date: 12/29/2023 CLINICAL DATA:  Headaches and neck pain, history of fall several weeks ago, initial encounter EXAM: CT HEAD WITHOUT CONTRAST CT CERVICAL SPINE WITHOUT CONTRAST TECHNIQUE: Multidetector CT imaging of the head and cervical spine was performed following the standard protocol without intravenous contrast. Multiplanar CT image reconstructions of the  cervical spine were also generated. RADIATION DOSE REDUCTION: This exam was performed according to the departmental dose-optimization program which includes automated exposure control, adjustment of the mA and/or kV according to patient size and/or use of iterative reconstruction technique. COMPARISON:  None Available. FINDINGS: CT HEAD FINDINGS Brain: No evidence of acute infarction, hemorrhage, hydrocephalus, extra-axial collection or mass lesion/mass effect. Chronic atrophic and ischemic changes are noted. Vascular: No hyperdense vessel or unexpected calcification. Skull: Normal. Negative for fracture or focal lesion. Sinuses/Orbits: No acute finding. Other: None. CT CERVICAL SPINE FINDINGS Alignment: Mild degenerative anterolisthesis of C4 on C5 is noted. Alignment is otherwise within normal limits. Skull base and vertebrae: 7 cervical segments are well visualized. Multilevel osteophytic change and facet hypertrophic changes are noted. No acute fracture or acute facet abnormality is noted.  The odontoid is within normal limits. Soft tissues and spinal canal: Surrounding soft tissue structures are within normal limits. Vascular calcifications seen. Upper chest: Visualized lung apices are unremarkable. Other: None IMPRESSION: CT of the head: Chronic atrophic and ischemic changes. CT of the cervical spine: Mild degenerative change without acute abnormality. Electronically Signed   By: Violeta Grey M.D.   On: 12/29/2023 22:56   CT Cervical Spine Wo Contrast Result Date: 12/29/2023 CLINICAL DATA:  Headaches and neck pain, history of fall several weeks ago, initial encounter EXAM: CT HEAD WITHOUT CONTRAST CT CERVICAL SPINE WITHOUT CONTRAST TECHNIQUE: Multidetector CT imaging of the head and cervical spine was performed following the standard protocol without intravenous contrast. Multiplanar CT image reconstructions of the cervical spine were also generated. RADIATION DOSE REDUCTION: This exam was performed according to the departmental dose-optimization program which includes automated exposure control, adjustment of the mA and/or kV according to patient size and/or use of iterative reconstruction technique. COMPARISON:  None Available. FINDINGS: CT HEAD FINDINGS Brain: No evidence of acute infarction, hemorrhage, hydrocephalus, extra-axial collection or mass lesion/mass effect. Chronic atrophic and ischemic changes are noted. Vascular: No hyperdense vessel or unexpected calcification. Skull: Normal. Negative for fracture or focal lesion. Sinuses/Orbits: No acute finding. Other: None. CT CERVICAL SPINE FINDINGS Alignment: Mild degenerative anterolisthesis of C4 on C5 is noted. Alignment is otherwise within normal limits. Skull base and vertebrae: 7 cervical segments are well visualized. Multilevel osteophytic change and facet hypertrophic changes are noted. No acute fracture or acute facet abnormality is noted. The odontoid is within normal limits. Soft tissues and spinal canal: Surrounding soft tissue  structures are within normal limits. Vascular calcifications seen. Upper chest: Visualized lung apices are unremarkable. Other: None IMPRESSION: CT of the head: Chronic atrophic and ischemic changes. CT of the cervical spine: Mild degenerative change without acute abnormality. Electronically Signed   By: Violeta Grey M.D.   On: 12/29/2023 22:56   DG Chest 1 View Result Date: 12/29/2023 CLINICAL DATA:  Headaches EXAM: PORTABLE CHEST 1 VIEW COMPARISON:  11/26/2021 FINDINGS: Check shadow is enlarged but stable. Aortic calcifications are noted. The lungs are well aerated bilaterally. Mild interstitial changes are seen with mild edema. Old rib fractures on the left are noted. IMPRESSION: Mild interstitial edema. Electronically Signed   By: Violeta Grey M.D.   On: 12/29/2023 20:42   Data Reviewed for HPI: Relevant notes from primary care and specialist visits, past discharge summaries as available in EHR, including Care Everywhere. Prior diagnostic testing as pertinent to current admission diagnoses Updated medications and problem lists for reconciliation ED course, including vitals, labs, imaging, treatment and response to treatment Triage notes, nursing and pharmacy notes and ED provider's  notes Notable results as noted above in HPI      Assessment and Plan: * NSTEMI (non-ST elevated myocardial infarction) (HCC) Elevated troponin 43-103 symptomatic for dizzy spells Continue heparin , aspirin  N.p.o. in case of procedure Cardiology consult  Headache Patient has headache, neck pain and has pain behind eyes intermittently over several weeks Will get a sed rate  Type 2 diabetes mellitus (HCC) Sliding scale insulin  coverage  Benign essential hypertension Not currently on meds    DVT prophylaxis: heparin  infusion  Consults: [facility] cardiology  Advance Care Planning:   Code Status: Full Code   Family Communication: none  Disposition Plan: Back to previous home environment  Severity of  Illness: The appropriate patient status for this patient is OBSERVATION. Observation status is judged to be reasonable and necessary in order to provide the required intensity of service to ensure the patient's safety. The patient's presenting symptoms, physical exam findings, and initial radiographic and laboratory data in the context of their medical condition is felt to place them at decreased risk for further clinical deterioration. Furthermore, it is anticipated that the patient will be medically stable for discharge from the hospital within 2 midnights of admission.   Author: Lanetta Pion, MD 12/30/2023 3:55 AM  For on call review [email or web address].

## 2023-12-30 NOTE — Hospital Course (Signed)
 [name] Lucas

## 2023-12-30 NOTE — Care Management Obs Status (Signed)
 MEDICARE OBSERVATION STATUS NOTIFICATION   Patient Details  Name: [name] Lucas MRN: [id number] Date of Birth: 1932/06/12   Medicare Observation Status Notification Given:  Yes    Nellene Banana Muhannad Bignell, RN 12/30/2023, 12:22 PM

## 2023-12-30 NOTE — Progress Notes (Signed)
 PHARMACY - ANTICOAGULATION CONSULT NOTE  Pharmacy Consult for Heparin   Indication: chest pain/ACS  Allergies  Allergen Reactions   Amoxicillin Other (See Comments)    sick  sick  sick   Ciprofloxacin  Other (See Comments)    sick   Felodipine Other (See Comments)    n/v  n/v  n/v   Statins Other (See Comments)   Sulfa Antibiotics Nausea And Vomiting    Patient Measurements: Height: 5\' 3"  (160 cm) Weight: 61.2 kg (135 lb) IBW/kg (Calculated) : 52.4 HEPARIN  DW (KG): 61.2  Vital Signs: Temp: 98.4 F (36.9 C) (05/23 1508) Temp Source: Oral (05/23 1242) BP: 116/73 (05/23 1508) Pulse Rate: 70 (05/23 1508)  Labs: Recent Labs    12/29/23 2023 12/29/23 2223 12/30/23 0808 12/30/23 1306 12/30/23 1555  HGB 9.0*  --   --  8.4*  --   HCT 28.5*  --   --  26.5*  --   PLT 168  --   --  155  --   HEPARINUNFRC  --   --  0.34  --  0.14*  CREATININE 0.91  --   --   --   --   TROPONINIHS 45* 103*  --  562*  --     Estimated Creatinine Clearance: 34.7 mL/min (by C-G formula based on SCr of 0.91 mg/dL).   Medical History: Past Medical History:  Diagnosis Date   Diabetes mellitus without complication (HCC)    HLD (hyperlipidemia)    Hypertension    Kidney stones    PAD (peripheral artery disease) (HCC)     Medications:  Medications Prior to Admission  Medication Sig Dispense Refill Last Dose/Taking   acetaminophen  (TYLENOL ) 500 MG tablet Take 1,000 mg by mouth every 6 (six) hours as needed.   Taking As Needed   aspirin  EC 81 MG tablet Take 1 tablet (81 mg total) by mouth daily. Swallow whole. 150 tablet 2 12/29/2023 Morning   Cholecalciferol  (VITAMIN D3) 50 MCG (2000 UT) capsule Take 2,000 Units by mouth daily.   12/28/2023   clopidogrel  (PLAVIX ) 75 MG tablet Take 1 tablet (75 mg total) by mouth daily. 30 tablet 11 12/29/2023 at  8:30 AM   [facility] VITAMIN B12 1000 MCG tablet Take 1,000 mcg by mouth daily.   12/28/2023   gabapentin (NEURONTIN) 100 MG capsule Take 100 mg by mouth  2 (two) times daily.   12/29/2023   omeprazole (PRILOSEC) 20 MG capsule Take 20 mg by mouth daily.   12/29/2023 Morning   potassium chloride  (KLOR-CON ) 10 MEQ tablet Take 10 mEq by mouth at bedtime.   12/28/2023   albuterol  (PROVENTIL  HFA;VENTOLIN  HFA) 108 (90 Base) MCG/ACT inhaler Inhale 2 puffs into the lungs every 6 (six) hours as needed for wheezing or shortness of breath. (Patient not taking: Reported on 05/23/2023) 1 Inhaler 0 Not Taking    Assessment: Pharmacy consulted to dose heparin  in this 89 year old female admitted with ACS/NSTEMI.  No prior anticoag noted. CrCl = 34.7 ml/min   Goal of Therapy:  Heparin  level 0.3-0.7 units/ml Monitor platelets by anticoagulation protocol: Yes   5/23 0808 HL 0.34, therapeutic x 1 5/23 1555 HL 0.14, subtherapeutic   Plan:  HL subtherapeutic Give 1800 unit bolus x 1 Increase heparin  infusion to 900 units/hr Recheck HL in 8 hours to confirm Daily CBC while on heparin   Alice Innocent, PharmD 12/30/2023,4:24 PM

## 2023-12-30 NOTE — Assessment & Plan Note (Signed)
 Patient has headache, neck pain and has pain behind eyes intermittently over several weeks Will get a sed rate

## 2023-12-30 NOTE — Progress Notes (Addendum)
 Progress Note    [name] Lucas  YNW:[id number] DOB: 26-Feb-1932  DOA: 12/29/2023 PCP: Jimmy Moulding, MD      Brief Narrative:    Medical records reviewed and are as summarized below:  [name] is a 89 y.o. female with medical history significant for type II DM, hypertension, PAD, who presented to the hospital with headache, neck pain and dizzy spells.  Headache is usually around the back of her head.  Headache and neck pain have been going on for some time now.  She's also had dizzy spells intermittently for some time now. In the ED, she was found to have bradycardia with heart rate in the 50s and also had sinus pauses on telemetry.  Troponins were elevated from 43-203.  She was admitted to the hospital for further workup.           Assessment/Plan:   Principal Problem:   Dizziness Active Problems:   Benign essential hypertension   Type 2 diabetes mellitus (HCC)   Headache   Sinus bradycardia   Demand ischemia (HCC)   Severe aortic stenosis   Cervical spine degeneration    Body mass index is 23.91 kg/m.    Elevated troponins: Troponin up from 43-103-562.  No chest pain or shortness of breath.  Probably due to demand ischemia.  Cardiologist recommended continuing IV heparin  drip for now.    Sinus bradycardia with sinus pauses:  Monitor on telemetry   Dizziness: May be due to severe aortic stenosis and/or bradycardia..   Severe aortic stenosis, valve heart disease: This may be causing her dizziness.  Follow-up with cardiologist for further recommendation. 2D echo showed EF estimated at 55 to 60%, moderate LVH, indeterminate LV diastolic parameters, moderately elevated pulmonary artery systolic pressure, moderate mitral regurgitation, mild to moderate tricuspid regurgitation.   Degenerative disc disease of the cervical spine: This could be the cause of her headaches and neck pain.  Analgesics as needed for pain.   Chronic anemia:  Hemoglobin down from 9-8.4.  Check iron studies and vitamin B12 level.   Peripheral vascular disease: She was on aspirin  and Plavix . Hold Plavix  for now given downtrending hemoglobin.   Hyperlipidemia, statin intolerance: LDL was 132, triglycerides 257 and total cholesterol 227 in September 2024.  Repeat lipid panel. Discussed PCSK9 inhibitor in the outpatient setting   Type II DM: Glucose levels are stable.  Hemoglobin A1c 5.0.  Discontinue NovoLog  sliding scale.   Diet Order             Diet Heart Fluid consistency: Thin  Diet effective now                            Consultants: Cardiologist  Procedures: None    Medications:    aspirin  EC  81 mg Oral Daily   pantoprazole   40 mg Oral Daily   Continuous Infusions:  heparin        Anti-infectives (From admission, onward)    None              Family Communication/Anticipated D/C date and plan/Code Status   DVT prophylaxis:      Code Status: Full Code  Family Communication: Plan discussed with [name], son, at the bedside Disposition Plan: Plan to discharge home   Status is: Observation The patient will require care spanning > 2 midnights and should be moved to inpatient because: Severe aortic stenosis, elevated troponins, dizziness  Subjective:   Interval events noted.  She complains of neck pain and pain in the back of her neck.  No dizziness at this time.  No palpitations, shortness of breath or chest pain.  [name], son, was at the bedside  Objective:    Vitals:   12/30/23 1031 12/30/23 1242 12/30/23 1300 12/30/23 1508  BP:  (!) 150/43 (!) 159/77 116/73  Pulse:  (!) 50 74 70  Resp:  12 20   Temp: 98.3 F (36.8 C) (!) 97.5 F (36.4 C)  98.4 F (36.9 C)  TempSrc: Oral Oral    SpO2:  99% 98% 98%  Weight:      Height:       No data found.  No intake or output data in the 24 hours ending 12/30/23 1508 Filed Weights   12/29/23 2015  Weight: 61.2 kg     Exam:  GEN: NAD SKIN: Warm and dry EYES: No pallor or icterus ENT: MMM CV: RRR, ejection systolic murmur loudest in the aortic area, pansystolic murmur loudest in the mitral area PULM: CTA B ABD: soft, ND, NT, +BS CNS: AAO x 3, non focal EXT: B/l leg edema, no tenderness       Data Reviewed:   I have personally reviewed following labs and imaging studies:  Labs: Labs show the following:   Basic Metabolic Panel: Recent Labs  Lab 12/29/23 2023  NA 139  K 4.3  CL 107  CO2 22  GLUCOSE 148*  BUN 20  CREATININE 0.91  CALCIUM 9.3   GFR Estimated Creatinine Clearance: 34.7 mL/min (by C-G formula based on SCr of 0.91 mg/dL). Liver Function Tests: No results for input(s): "AST", "ALT", "ALKPHOS", "BILITOT", "PROT", "ALBUMIN" in the last 168 hours. No results for input(s): "LIPASE", "AMYLASE" in the last 168 hours. No results for input(s): "AMMONIA" in the last 168 hours. Coagulation profile No results for input(s): "INR", "PROTIME" in the last 168 hours.  CBC: Recent Labs  Lab 12/29/23 2023 12/30/23 1306  WBC 6.1 6.4  HGB 9.0* 8.4*  HCT 28.5* 26.5*  MCV 100.4* 100.0  PLT 168 155   Cardiac Enzymes: No results for input(s): "CKTOTAL", "CKMB", "CKMBINDEX", "TROPONINI" in the last 168 hours. BNP (last 3 results) No results for input(s): "PROBNP" in the last 8760 hours. CBG: Recent Labs  Lab 12/30/23 0031 12/30/23 0506 12/30/23 0749 12/30/23 1133  GLUCAP 104* 95 138* 99   D-Dimer: No results for input(s): "DDIMER" in the last 72 hours. Hgb A1c: Recent Labs    12/29/23 2332  HGBA1C 5.0   Lipid Profile: No results for input(s): "CHOL", "HDL", "LDLCALC", "TRIG", "CHOLHDL", "LDLDIRECT" in the last 72 hours. Thyroid function studies: Recent Labs    12/30/23 0508  TSH 4.325   Anemia work up: No results for input(s): "VITAMINB12", "FOLATE", "FERRITIN", "TIBC", "IRON", "RETICCTPCT" in the last 72 hours. Sepsis Labs: Recent Labs  Lab  12/29/23 2023 12/30/23 1306  WBC 6.1 6.4    Microbiology No results found for this or any previous visit (from the past 240 hours).  Procedures and diagnostic studies:  ECHOCARDIOGRAM COMPLETE Result Date: 12/30/2023    ECHOCARDIOGRAM REPORT   Patient Name:   [name] Lucas Date of Exam: 12/30/2023 Medical Rec #:  [id number]      Height:       63.0 in Accession #:    [id number]     Weight:       135.0 lb Date of Birth:  [birth year]  BSA:          1.636 m Patient Age:    89 years       BP:           145/67 mmHg Patient Gender: F              HR:           75 bpm. Exam Location:  [facility] Procedure: 2D Echo, Cardiac Doppler and Color Doppler (Both Spectral and Color            Flow Doppler were utilized during procedure). Indications:     NSTEMI I21.4  History:         Patient has no prior history of Echocardiogram examinations.                  Risk Factors:Hypertension.  Sonographer:     Kathaleen Pale Roar Referring Phys:  [id number] Sheril Dines Diagnosing Phys: Timothy Gollan MD IMPRESSIONS  1. Left ventricular ejection fraction, by estimation, is 55 to 60%. Left ventricular ejection fraction by PLAX is 56 %. The left ventricle has normal function. The left ventricle has no regional wall motion abnormalities. There is moderate left ventricular hypertrophy. Left ventricular diastolic parameters are indeterminate.  2. Right ventricular systolic function is normal. The right ventricular size is normal. There is moderately elevated pulmonary artery systolic pressure. The estimated right ventricular systolic pressure is 49.9 mmHg.  3. Left atrial size was mild to moderately dilated.  4. The mitral valve is normal in structure. Moderate mitral valve regurgitation. No evidence of mitral stenosis. The mean mitral valve gradient is 4.0 mmHg. Moderate mitral annular calcification.  5. Tricuspid valve regurgitation is mild to moderate.  6. The aortic valve is calcified. Aortic valve regurgitation is mild. Severe aortic valve  stenosis. Aortic valve area, by VTI measures 0.68 cm. Aortic valve mean gradient measures 51.0 mmHg. Aortic valve Vmax measures 4.64 m/s.  7. The inferior vena cava is normal in size with greater than 50% respiratory variability, suggesting right atrial pressure of 3 mmHg. FINDINGS  Left Ventricle: Left ventricular ejection fraction, by estimation, is 55 to 60%. Left ventricular ejection fraction by PLAX is 56 %. The left ventricle has normal function. The left ventricle has no regional wall motion abnormalities. Strain was performed and the global longitudinal strain is indeterminate. The left ventricular internal cavity size was normal in size. There is moderate left ventricular hypertrophy. Left ventricular diastolic parameters are indeterminate. Right Ventricle: The right ventricular size is normal. No increase in right ventricular wall thickness. Right ventricular systolic function is normal. There is moderately elevated pulmonary artery systolic pressure. The tricuspid regurgitant velocity is 3.35 m/s, and with an assumed right atrial pressure of 5 mmHg, the estimated right ventricular systolic pressure is 49.9 mmHg. Left Atrium: Left atrial size was mild to moderately dilated. Right Atrium: Right atrial size was normal in size. Pericardium: There is no evidence of pericardial effusion. Mitral Valve: The mitral valve is normal in structure. Moderate mitral annular calcification. Moderate mitral valve regurgitation. No evidence of mitral valve stenosis. MV peak gradient, 12.4 mmHg. The mean mitral valve gradient is 4.0 mmHg. Tricuspid Valve: The tricuspid valve is normal in structure. Tricuspid valve regurgitation is mild to moderate. No evidence of tricuspid stenosis. Aortic Valve: The aortic valve is calcified. Aortic valve regurgitation is mild. Aortic regurgitation PHT measures 568 msec. Severe aortic stenosis is present. Aortic valve mean gradient measures 51.0 mmHg. Aortic valve peak gradient measures  85.9 mmHg.  Aortic valve area, by VTI measures 0.68 cm. Pulmonic Valve: The pulmonic valve was normal in structure. Pulmonic valve regurgitation is not visualized. No evidence of pulmonic stenosis. Aorta: The aortic root is normal in size and structure. Venous: The inferior vena cava is normal in size with greater than 50% respiratory variability, suggesting right atrial pressure of 3 mmHg. IAS/Shunts: No atrial level shunt detected by color flow Doppler. Additional Comments: 3D was performed not requiring image post processing on an independent workstation and was indeterminate.  LEFT VENTRICLE PLAX 2D LV EF:         Left            Diastology                ventricular     LV e' medial:    3.64 cm/s                ejection        LV E/e' medial:  48.1                fraction by     LV e' lateral:   6.25 cm/s                PLAX is 56      LV E/e' lateral: 28.0                %. LVIDd:         4.50 cm LVIDs:         3.20 cm LV PW:         1.50 cm LV IVS:        1.70 cm LVOT diam:     1.60 cm LV SV:         82 LV SV Index:   50 LVOT Area:     2.01 cm  RIGHT VENTRICLE RV Basal diam:  3.00 cm RV Mid diam:    2.60 cm RV S prime:     9.65 cm/s TAPSE (M-mode): 1.9 cm LEFT ATRIUM             Index        RIGHT ATRIUM           Index LA diam:        4.50 cm 2.75 cm/m   RA Area:     16.60 cm LA Vol (A2C):   74.1 ml 45.29 ml/m  RA Volume:   43.10 ml  26.34 ml/m LA Vol (A4C):   81.7 ml 49.93 ml/m LA Biplane Vol: 78.9 ml 48.22 ml/m  AORTIC VALVE                     PULMONIC VALVE AV Area (Vmax):    0.73 cm      PV Vmax:          1.22 m/s AV Area (Vmean):   0.64 cm      PV Peak grad:     6.0 mmHg AV Area (VTI):     0.68 cm      PR End Diast Vel: 3.70 msec AV Vmax:           463.50 cm/s   RVOT Peak grad:   1 mmHg AV Vmean:          338.500 cm/s AV VTI:            1.205 m AV Peak Grad:      85.9 mmHg AV Mean Grad:  51.0 mmHg LVOT Vmax:         169.00 cm/s LVOT Vmean:        107.000 cm/s LVOT VTI:          0.406 m  LVOT/AV VTI ratio: 0.34 AI PHT:            568 msec  AORTA Ao Root diam: 2.60 cm Ao Asc diam:  2.90 cm MITRAL VALVE                TRICUSPID VALVE MV Area (PHT): 4.31 cm     TR Peak grad:   44.9 mmHg MV Area VTI:   2.06 cm     TR Vmax:        335.00 cm/s MV Peak grad:  12.4 mmHg MV Mean grad:  4.0 mmHg     SHUNTS MV Vmax:       1.76 m/s     Systemic VTI:  0.41 m MV Vmean:      95.7 cm/s    Systemic Diam: 1.60 cm MV Decel Time: 176 msec MV E velocity: 175.00 cm/s MV A velocity: 112.00 cm/s MV E/A ratio:  1.56 MV A Prime:    4.8 cm/s Belva Boyden MD Electronically signed by Belva Boyden MD Signature Date/Time: 12/30/2023/1:00:12 PM    Final (Updated)    CT Head Wo Contrast Result Date: 12/29/2023 CLINICAL DATA:  Headaches and neck pain, history of fall several weeks ago, initial encounter EXAM: CT HEAD WITHOUT CONTRAST CT CERVICAL SPINE WITHOUT CONTRAST TECHNIQUE: Multidetector CT imaging of the head and cervical spine was performed following the standard protocol without intravenous contrast. Multiplanar CT image reconstructions of the cervical spine were also generated. RADIATION DOSE REDUCTION: This exam was performed according to the departmental dose-optimization program which includes automated exposure control, adjustment of the mA and/or kV according to patient size and/or use of iterative reconstruction technique. COMPARISON:  None Available. FINDINGS: CT HEAD FINDINGS Brain: No evidence of acute infarction, hemorrhage, hydrocephalus, extra-axial collection or mass lesion/mass effect. Chronic atrophic and ischemic changes are noted. Vascular: No hyperdense vessel or unexpected calcification. Skull: Normal. Negative for fracture or focal lesion. Sinuses/Orbits: No acute finding. Other: None. CT CERVICAL SPINE FINDINGS Alignment: Mild degenerative anterolisthesis of C4 on C5 is noted. Alignment is otherwise within normal limits. Skull base and vertebrae: 7 cervical segments are well visualized.  Multilevel osteophytic change and facet hypertrophic changes are noted. No acute fracture or acute facet abnormality is noted. The odontoid is within normal limits. Soft tissues and spinal canal: Surrounding soft tissue structures are within normal limits. Vascular calcifications seen. Upper chest: Visualized lung apices are unremarkable. Other: None IMPRESSION: CT of the head: Chronic atrophic and ischemic changes. CT of the cervical spine: Mild degenerative change without acute abnormality. Electronically Signed   By: Violeta Grey M.D.   On: 12/29/2023 22:56   CT Cervical Spine Wo Contrast Result Date: 12/29/2023 CLINICAL DATA:  Headaches and neck pain, history of fall several weeks ago, initial encounter EXAM: CT HEAD WITHOUT CONTRAST CT CERVICAL SPINE WITHOUT CONTRAST TECHNIQUE: Multidetector CT imaging of the head and cervical spine was performed following the standard protocol without intravenous contrast. Multiplanar CT image reconstructions of the cervical spine were also generated. RADIATION DOSE REDUCTION: This exam was performed according to the departmental dose-optimization program which includes automated exposure control, adjustment of the mA and/or kV according to patient size and/or use of iterative reconstruction technique. COMPARISON:  None Available. FINDINGS: CT HEAD FINDINGS Brain: No evidence  of acute infarction, hemorrhage, hydrocephalus, extra-axial collection or mass lesion/mass effect. Chronic atrophic and ischemic changes are noted. Vascular: No hyperdense vessel or unexpected calcification. Skull: Normal. Negative for fracture or focal lesion. Sinuses/Orbits: No acute finding. Other: None. CT CERVICAL SPINE FINDINGS Alignment: Mild degenerative anterolisthesis of C4 on C5 is noted. Alignment is otherwise within normal limits. Skull base and vertebrae: 7 cervical segments are well visualized. Multilevel osteophytic change and facet hypertrophic changes are noted. No acute fracture or  acute facet abnormality is noted. The odontoid is within normal limits. Soft tissues and spinal canal: Surrounding soft tissue structures are within normal limits. Vascular calcifications seen. Upper chest: Visualized lung apices are unremarkable. Other: None IMPRESSION: CT of the head: Chronic atrophic and ischemic changes. CT of the cervical spine: Mild degenerative change without acute abnormality. Electronically Signed   By: Violeta Grey M.D.   On: 12/29/2023 22:56   DG Chest 1 View Result Date: 12/29/2023 CLINICAL DATA:  Headaches EXAM: PORTABLE CHEST 1 VIEW COMPARISON:  11/26/2021 FINDINGS: Check shadow is enlarged but stable. Aortic calcifications are noted. The lungs are well aerated bilaterally. Mild interstitial changes are seen with mild edema. Old rib fractures on the left are noted. IMPRESSION: Mild interstitial edema. Electronically Signed   By: Violeta Grey M.D.   On: 12/29/2023 20:42               LOS: 0 days   Yonis Carreon  [facility]   Pager on [email or web address]. If 7PM-7AM, please contact night-coverage at [email or web address]     12/30/2023, 3:08 PM

## 2023-12-30 NOTE — Progress Notes (Signed)
*  PRELIMINARY RESULTS* Echocardiogram 2D Echocardiogram has been performed.  [name] Lucas Taffie Eckmann 12/30/2023, 11:28 AM

## 2023-12-30 NOTE — Assessment & Plan Note (Signed)
Not currently on meds.   °

## 2023-12-30 NOTE — Assessment & Plan Note (Signed)
 Sliding scale insulin coverage

## 2023-12-30 NOTE — ED Notes (Signed)
 Assumed patient care and received report from the previous nurse. Patient resting comfortably at time of assuming care. Patient called out needing to use the restroom shortly after and was assisted with use of a wheelchair to the bathroom by the NT.

## 2023-12-30 NOTE — Assessment & Plan Note (Deleted)
 Elevated troponin 43-103 symptomatic for dizzy spells Continue heparin , aspirin  N.p.o. in case of procedure Cardiology consult

## 2023-12-31 DIAGNOSIS — Z7189 Other specified counseling: Secondary | ICD-10-CM

## 2023-12-31 DIAGNOSIS — R42 Dizziness and giddiness: Secondary | ICD-10-CM

## 2023-12-31 DIAGNOSIS — D649 Anemia, unspecified: Secondary | ICD-10-CM

## 2023-12-31 DIAGNOSIS — I739 Peripheral vascular disease, unspecified: Secondary | ICD-10-CM | POA: Diagnosis not present

## 2023-12-31 DIAGNOSIS — Z880 Allergy status to penicillin: Secondary | ICD-10-CM | POA: Diagnosis not present

## 2023-12-31 DIAGNOSIS — E785 Hyperlipidemia, unspecified: Secondary | ICD-10-CM | POA: Diagnosis present

## 2023-12-31 DIAGNOSIS — R519 Headache, unspecified: Secondary | ICD-10-CM | POA: Diagnosis present

## 2023-12-31 DIAGNOSIS — Z7401 Bed confinement status: Secondary | ICD-10-CM | POA: Diagnosis not present

## 2023-12-31 DIAGNOSIS — I2510 Atherosclerotic heart disease of native coronary artery without angina pectoris: Secondary | ICD-10-CM | POA: Diagnosis present

## 2023-12-31 DIAGNOSIS — R001 Bradycardia, unspecified: Secondary | ICD-10-CM | POA: Diagnosis present

## 2023-12-31 DIAGNOSIS — Z7902 Long term (current) use of antithrombotics/antiplatelets: Secondary | ICD-10-CM | POA: Diagnosis not present

## 2023-12-31 DIAGNOSIS — I951 Orthostatic hypotension: Secondary | ICD-10-CM | POA: Diagnosis present

## 2023-12-31 DIAGNOSIS — I350 Nonrheumatic aortic (valve) stenosis: Secondary | ICD-10-CM

## 2023-12-31 DIAGNOSIS — D539 Nutritional anemia, unspecified: Secondary | ICD-10-CM | POA: Diagnosis present

## 2023-12-31 DIAGNOSIS — E1151 Type 2 diabetes mellitus with diabetic peripheral angiopathy without gangrene: Secondary | ICD-10-CM | POA: Diagnosis present

## 2023-12-31 DIAGNOSIS — R7989 Other specified abnormal findings of blood chemistry: Secondary | ICD-10-CM | POA: Diagnosis present

## 2023-12-31 DIAGNOSIS — Z881 Allergy status to other antibiotic agents status: Secondary | ICD-10-CM | POA: Diagnosis not present

## 2023-12-31 DIAGNOSIS — Z7982 Long term (current) use of aspirin: Secondary | ICD-10-CM | POA: Diagnosis not present

## 2023-12-31 DIAGNOSIS — M5030 Other cervical disc degeneration, unspecified cervical region: Secondary | ICD-10-CM | POA: Diagnosis present

## 2023-12-31 DIAGNOSIS — D509 Iron deficiency anemia, unspecified: Secondary | ICD-10-CM | POA: Diagnosis present

## 2023-12-31 DIAGNOSIS — Z66 Do not resuscitate: Secondary | ICD-10-CM | POA: Diagnosis present

## 2023-12-31 DIAGNOSIS — I2489 Other forms of acute ischemic heart disease: Secondary | ICD-10-CM | POA: Diagnosis present

## 2023-12-31 DIAGNOSIS — Z87891 Personal history of nicotine dependence: Secondary | ICD-10-CM | POA: Diagnosis not present

## 2023-12-31 DIAGNOSIS — Z882 Allergy status to sulfonamides status: Secondary | ICD-10-CM | POA: Diagnosis not present

## 2023-12-31 DIAGNOSIS — I5A Non-ischemic myocardial injury (non-traumatic): Secondary | ICD-10-CM | POA: Diagnosis present

## 2023-12-31 DIAGNOSIS — I272 Pulmonary hypertension, unspecified: Secondary | ICD-10-CM | POA: Diagnosis present

## 2023-12-31 DIAGNOSIS — Z888 Allergy status to other drugs, medicaments and biological substances status: Secondary | ICD-10-CM | POA: Diagnosis not present

## 2023-12-31 DIAGNOSIS — Z515 Encounter for palliative care: Secondary | ICD-10-CM | POA: Diagnosis not present

## 2023-12-31 DIAGNOSIS — I10 Essential (primary) hypertension: Secondary | ICD-10-CM | POA: Diagnosis present

## 2023-12-31 DIAGNOSIS — I441 Atrioventricular block, second degree: Secondary | ICD-10-CM | POA: Diagnosis present

## 2023-12-31 LAB — PREPARE RBC (CROSSMATCH)

## 2023-12-31 LAB — ABO/RH: ABO/RH(D): O POS

## 2023-12-31 LAB — CBC
HCT: 24.9 % — ABNORMAL LOW (ref 36.0–46.0)
Hemoglobin: 7.9 g/dL — ABNORMAL LOW (ref 12.0–15.0)
MCH: 31.7 pg (ref 26.0–34.0)
MCHC: 31.7 g/dL (ref 30.0–36.0)
MCV: 100 fL (ref 80.0–100.0)
Platelets: 147 10*3/uL — ABNORMAL LOW (ref 150–400)
RBC: 2.49 MIL/uL — ABNORMAL LOW (ref 3.87–5.11)
RDW: 13.2 % (ref 11.5–15.5)
WBC: 5.6 10*3/uL (ref 4.0–10.5)
nRBC: 0 % (ref 0.0–0.2)

## 2023-12-31 LAB — LIPID PANEL
Cholesterol: 204 mg/dL — ABNORMAL HIGH (ref 0–200)
HDL: 45 mg/dL (ref >40)
LDL Cholesterol: 138 mg/dL — ABNORMAL HIGH (ref 0–99)
Total CHOL/HDL Ratio: 4.5 ratio
Triglycerides: 104 mg/dL (ref <150)
VLDL: 21 mg/dL (ref 0–40)

## 2023-12-31 LAB — HEPARIN LEVEL (UNFRACTIONATED): Heparin Unfractionated: 0.4 [IU]/mL (ref 0.30–0.70)

## 2023-12-31 LAB — CREATININE, SERUM
Creatinine, Ser: 0.88 mg/dL (ref 0.44–1.00)
GFR, Estimated: >60 mL/min (ref >60)

## 2023-12-31 MED ORDER — HEPARIN SODIUM (PORCINE) 5000 UNIT/ML IJ SOLN
5000.0000 [IU] | Freq: Three times a day (TID) | INTRAMUSCULAR | Status: DC
  Administered 2023-12-31 – 2024-01-01 (×4): 5000 [IU] via SUBCUTANEOUS
  Filled 2023-12-31 (×4): qty 1

## 2023-12-31 MED ORDER — SODIUM CHLORIDE 0.9% IV SOLUTION: Freq: Once | INTRAVENOUS | Status: AC

## 2023-12-31 NOTE — Progress Notes (Signed)
 Blood reached patient at 1535.

## 2023-12-31 NOTE — Plan of Care (Signed)

## 2023-12-31 NOTE — Progress Notes (Signed)
 PHARMACY - ANTICOAGULATION CONSULT NOTE  Pharmacy Consult for Heparin   Indication: chest pain/ACS  Allergies  Allergen Reactions   Amoxicillin Other (See Comments)    sick  sick  sick   Ciprofloxacin  Other (See Comments)    sick   Felodipine Other (See Comments)    n/v  n/v  n/v   Statins Other (See Comments)   Sulfa Antibiotics Nausea And Vomiting    Patient Measurements: Height: 5\' 3"  (160 cm) Weight: 61.2 kg (135 lb) IBW/kg (Calculated) : 52.4 HEPARIN  DW (KG): 61.2  Vital Signs: Temp: 98.4 F (36.9 C) (05/23 2313) Temp Source: Oral (05/23 2313) BP: 111/55 (05/23 2313) Pulse Rate: 68 (05/23 2313)  Labs: Recent Labs    12/29/23 2023 12/29/23 2223 12/30/23 0808 12/30/23 1306 12/30/23 1555 12/31/23 0147  HGB 9.0*  --   --  8.4*  --  7.9*  HCT 28.5*  --   --  26.5*  --  24.9*  PLT 168  --   --  155  --  147*  HEPARINUNFRC  --   --  0.34  --  0.14* 0.40  CREATININE 0.91  --   --   --   --   --   TROPONINIHS 45* 103*  --  562*  --   --     Estimated Creatinine Clearance: 34.7 mL/min (by C-G formula based on SCr of 0.91 mg/dL).   Medical History: Past Medical History:  Diagnosis Date   Diabetes mellitus without complication (HCC)    HLD (hyperlipidemia)    Hypertension    Kidney stones    PAD (peripheral artery disease) (HCC)     Medications:  Medications Prior to Admission  Medication Sig Dispense Refill Last Dose/Taking   acetaminophen  (TYLENOL ) 500 MG tablet Take 1,000 mg by mouth every 6 (six) hours as needed.   Taking As Needed   aspirin  EC 81 MG tablet Take 1 tablet (81 mg total) by mouth daily. Swallow whole. 150 tablet 2 12/29/2023 Morning   Cholecalciferol  (VITAMIN D3) 50 MCG (2000 UT) capsule Take 2,000 Units by mouth daily.   12/28/2023   clopidogrel  (PLAVIX ) 75 MG tablet Take 1 tablet (75 mg total) by mouth daily. 30 tablet 11 12/29/2023 at  8:30 AM   [facility] VITAMIN B12 1000 MCG tablet Take 1,000 mcg by mouth daily.   12/28/2023   gabapentin  (NEURONTIN) 100 MG capsule Take 100 mg by mouth 2 (two) times daily.   12/29/2023   omeprazole (PRILOSEC) 20 MG capsule Take 20 mg by mouth daily.   12/29/2023 Morning   potassium chloride  (KLOR-CON ) 10 MEQ tablet Take 10 mEq by mouth at bedtime.   12/28/2023   albuterol  (PROVENTIL  HFA;VENTOLIN  HFA) 108 (90 Base) MCG/ACT inhaler Inhale 2 puffs into the lungs every 6 (six) hours as needed for wheezing or shortness of breath. (Patient not taking: Reported on 05/23/2023) 1 Inhaler 0 Not Taking    Assessment: Pharmacy consulted to dose heparin  in this 89 year old female admitted with ACS/NSTEMI.  No prior anticoag noted. CrCl = 34.7 ml/min   Goal of Therapy:  Heparin  level 0.3-0.7 units/ml Monitor platelets by anticoagulation protocol: Yes   5/23 0808 HL 0.34, therapeutic x 1 5/23 1555 HL 0.14, subtherapeutic  5/24 0147 HL 0.40, therapeutic X 1  Plan:  5/24:  HL @ 0147 = 0.40, therapeutic X 1 - Will continue pt on current rate and recheck HL in 8 hrs.  Daily CBC while on heparin   Varnell Orvis D, PharmD 12/31/2023,2:41  AM

## 2023-12-31 NOTE — Progress Notes (Signed)
 Progress Note  Patient Name: [name] Lucas Date of Encounter: 12/31/2023  Primary Cardiologist: New - consult by Alvenia Aus   Subjective   Echo on 5/23 showed preserved LVSF with severe aortic stenosis with patient declining further testing or invasive intervention. Hgb continue to down-trend, now down to 7.9 (baseline around 13 in 04/2023). Remains on heparin  gtt at this time. No acute overnight cardiac events.   Inpatient Medications    Scheduled Meds:  aspirin  EC  81 mg Oral Daily   pantoprazole   40 mg Oral Daily   Continuous Infusions:  heparin  900 Units/hr (12/30/23 2254)   PRN Meds: acetaminophen , nitroGLYCERIN, ondansetron  (ZOFRAN ) IV   Vital Signs    Vitals:   12/30/23 1508 12/30/23 2014 12/30/23 2313 12/31/23 0405  BP: 116/73 (!) 125/53 (!) 111/55 (!) 132/52  Pulse: 70 63 68 68  Resp:  17 15 15   Temp: 98.4 F (36.9 C) 98.5 F (36.9 C) 98.4 F (36.9 C) 98.6 F (37 C)  TempSrc:  Oral Oral Oral  SpO2: 98% 96% 96% 100%  Weight:      Height:        Intake/Output Summary (Last 24 hours) at 12/31/2023 0757 Last data filed at 12/30/2023 2254 Gross per 24 hour  Intake 230.48 ml  Output --  Net 230.48 ml   Filed Weights   12/29/23 2015  Weight: 61.2 kg    Telemetry    SR with sinus bradycardia, blocked PACs, brief episode of 2nd degree AV block type I - Personally Reviewed  ECG    No new tracings - Personally Reviewed  Physical Exam   GEN: No acute distress.   Neck: No JVD. Cardiac: RRR, II/VI systolic murmur RUSB, no rubs, or gallops.  Respiratory: Clear to auscultation bilaterally.  GI: Soft, nontender, non-distended.   MS: No edema; No deformity. Neuro:  Alert and oriented x 3; Nonfocal.  Psych: Normal affect.  Labs    Chemistry Recent Labs  Lab 12/29/23 2023  NA 139  K 4.3  CL 107  CO2 22  GLUCOSE 148*  BUN 20  CREATININE 0.91  CALCIUM 9.3  GFRNONAA >60  ANIONGAP 10     Hematology Recent Labs  Lab 12/29/23 2023 12/30/23 1306  12/31/23 0147  WBC 6.1 6.4 5.6  RBC 2.84* 2.65* 2.49*  HGB 9.0* 8.4* 7.9*  HCT 28.5* 26.5* 24.9*  MCV 100.4* 100.0 100.0  MCH 31.7 31.7 31.7  MCHC 31.6 31.7 31.7  RDW 13.2 13.3 13.2  PLT 168 155 147*    Cardiac EnzymesNo results for input(s): "TROPONINI" in the last 168 hours. No results for input(s): "TROPIPOC" in the last 168 hours.   BNPNo results for input(s): "BNP", "PROBNP" in the last 168 hours.   DDimer No results for input(s): "DDIMER" in the last 168 hours.   Radiology    CT Head Wo Contrast Result Date: 12/29/2023 IMPRESSION: CT of the head: Chronic atrophic and ischemic changes. CT of the cervical spine: Mild degenerative change without acute abnormality. Electronically Signed   By: Violeta Grey M.D.   On: 12/29/2023 22:56   CT Cervical Spine Wo Contrast Result Date: 12/29/2023 IMPRESSION: CT of the head: Chronic atrophic and ischemic changes. CT of the cervical spine: Mild degenerative change without acute abnormality. Electronically Signed   By: Violeta Grey M.D.   On: 12/29/2023 22:56   DG Chest 1 View Result Date: 12/29/2023 IMPRESSION: Mild interstitial edema. Electronically Signed   By: Violeta Grey M.D.   On: 12/29/2023  20:42    Cardiac Studies   2D echo 12/30/2023: 1. Left ventricular ejection fraction, by estimation, is 55 to 60%. Left  ventricular ejection fraction by PLAX is 56 %. The left ventricle has  normal function. The left ventricle has no regional wall motion  abnormalities. There is moderate left  ventricular hypertrophy. Left ventricular diastolic parameters are  indeterminate.   2. Right ventricular systolic function is normal. The right ventricular  size is normal. There is moderately elevated pulmonary artery systolic  pressure. The estimated right ventricular systolic pressure is 49.9 mmHg.   3. Left atrial size was mild to moderately dilated.   4. The mitral valve is normal in structure. Moderate mitral valve  regurgitation. No  evidence of mitral stenosis. The mean mitral valve  gradient is 4.0 mmHg. Moderate mitral annular calcification.   5. Tricuspid valve regurgitation is mild to moderate.   6. The aortic valve is calcified. Aortic valve regurgitation is mild.  Severe aortic valve stenosis. Aortic valve area, by VTI measures 0.68 cm.  Aortic valve mean gradient measures 51.0 mmHg. Aortic valve Vmax measures  4.64 m/s.   7. The inferior vena cava is normal in size with greater than 50%  respiratory variability, suggesting right atrial pressure of 3 mmHg.  __________  2D echo 06/10/2015 Ivette Marks): NORMAL LEFT VENTRICULAR SYSTOLIC FUNCTION  NORMAL RIGHT VENTRICULAR SYSTOLIC FUNCTION  MILD VALVULAR REGURGITATION (See above)  NO VALVULAR STENOSIS  The technically difficult study  Normal overall left ventricular function ejection fraction greater than 55%   Patient Profile     89 y.o. female with history of PAD status post angioplasty to the right anterior tibial artery, mechanical thrombectomy of the right SFA and popliteal arteries, angioplasty to the right SFA and popliteal arteries, and and stenting to the right SFA in 05/2023 followed by vascular surgery, DM2, HTN, HLD, falls, prior tobacco use, and nephrolithiasis who was admitted with headache and dizziness is being seen today for the evaluation of elevated troponin at the request of Dr. Vallarie Gauze.   Assessment & Plan    1. Elevated troponin: -Mildly elevated and flat trending, not consistent with ACS trended to 562 -Possibly secondary to supply demand ischemia in the setting of severe aortic stenosis with acute anemia -No emergent indication for cardiac cath given lack of anginal symptoms, flat trending troponin, and  acute anemia - Echo with preserved LVSF - Stop heparin  gtt given down-trending Hgb, currently 7.9 with a baseline around 13 - Remains on aspirin  at this time   2. Severe aortic stenosis: - Long discussion at time of consult staffing by  attending with patient noted to have a very poor functional status and felt to be a marginal candidate for TAVR - Patient does not want to pursue invasive procedures as TAVR is not very likely to improve her quality of life  3.  Headache and dizziness: - Longstanding issue - She is noted to be bradycardic at times down into the 40s bpm with appropriate chronotropic competence noted, which may be contributing to dizziness - Dizziness possibly exacerbated by aortic stenosis - May benefit from MRI of the brain   4.  Macrocytic anemia: - Presenting hemoglobin of 9 with baseline hemoglobin approximately 13 including on recent check in 04/2023 - Down-trended to 7.9 this morning - Stop heparin  gtt - Cannot exclude Heyde syndrome as contributing  - Denies symptoms of bleeding - Possibly contributing to her presentation/dizziness - Remains on ASA given PAD - Recommend maintaining Hgb > 8 -  Management per IM   5.  HTN: - Blood pressure reasonably controlled - Continue to monitor   6.  HLD with statin intolerance: - LDL 132 in 04/2023 Target LDL less than 70 - Intolerant to statins - At this time, given advanced age and comorbid conditions, defer PCSK9i or bempedoic acid       For questions or updates, please contact [facility] Please consult [email or web address] for contact info under Cardiology/STEMI.    Signed, Varney Gentleman, PA-C [facility] Pager: [phone] 12/31/2023, 7:57 AM

## 2023-12-31 NOTE — Progress Notes (Signed)
 Progress Note    DEBIE Lucas  NFA:[id number] DOB: 20-Dec-1931  DOA: 12/29/2023 PCP: Jimmy Moulding, MD      Brief Narrative:    Medical records reviewed and are as summarized below:  [name] Lucas is a 89 y.o. female with medical history significant for type II DM, hypertension, PAD, who presented to the hospital with headache, neck pain and dizzy spells.  Headache is usually around the back of her head.  Headache and neck pain have been going on for some time now.  She's also had dizzy spells intermittently for some time now. In the ED, she was found to have bradycardia with heart rate in the 50s and also had sinus pauses on telemetry.  Troponins were elevated from 43-203.  She was admitted to the hospital for further workup.           Assessment/Plan:   Principal Problem:   Dizziness Active Problems:   Benign essential hypertension   Type 2 diabetes mellitus (HCC)   Headache   Sinus bradycardia   Demand ischemia (HCC)   Severe aortic stenosis   Cervical spine degeneration    Body mass index is 23.91 kg/m.    Elevated troponins: Troponin up from 43-103-562.  No chest pain or shortness of breath.  Probably due to demand ischemia.  IV heparin  has been discontinued   Sinus bradycardia with sinus pauses: Improved.  Monitor on telemetry   Dizziness: May be multifactorial from severe aortic stenosis, anemia    Severe aortic stenosis, valve heart disease: This may be causing her dizziness.  Patient not an ideal candidate for TAVR and patient has decided against TAVR or any aggressive measures.   2D echo showed EF estimated at 55 to 60%, moderate LVH, indeterminate LV diastolic parameters, moderately elevated pulmonary artery systolic pressure, moderate mitral regurgitation, mild to moderate tricuspid regurgitation.   Degenerative disc disease of the cervical spine: This could be the cause of her headaches and neck pain.  Analgesics as needed for  pain.   Acute on chronic anemia, iron deficiency anemia.: Hemoglobin down from 9-8.4-7.9.  This may be related to severe aortic stenosis. Given underlying cardiovascular disease, patient will benefit from blood transfusion. Discussed risks, benefits and alternatives of blood transfusion including IV iron infusion.  Patient and family opted for blood transfusion.  She will be transfused with 1 unit of PRBCs. Iron studies consistent with iron deficiency anemia (iron 25, TIBC 316, saturation ratio 8, ferritin 20).  Vitamin B12 level 615. Of note, [name] Lucas said patient had complications during endoscopy a few years ago and they are not interested in any endoscopic evaluation.   Peripheral vascular disease: She was on aspirin  and Plavix . Plavix  has been held because of downtrending hemoglobin and increased risk for bleeding.   Hyperlipidemia, statin intolerance: Repeat lipid panel (total cholesterol 204, triglycerides 104, HDL 45, LDL 138).  LDL was 132, triglycerides 257 and total cholesterol 227 in September 2024.   Discussed PCSK9 inhibitor in the outpatient setting   Type II DM: Glucose levels are stable.  Hemoglobin A1c 5.0.       Diet Order             Diet Heart Fluid consistency: Thin  Diet effective now                            Consultants: Cardiologist  Procedures: None    Medications:    sodium  chloride   Intravenous Once   aspirin  EC  81 mg Oral Daily   heparin   5,000 Units Subcutaneous Q8H   pantoprazole   40 mg Oral Daily   Continuous Infusions:     Anti-infectives (From admission, onward)    None              Family Communication/Anticipated D/C date and plan/Code Status   DVT prophylaxis: heparin  injection 5,000 Units Start: 12/31/23 0900     Code Status: Limited: Do not attempt resuscitation (DNR) -DNR-LIMITED -Do Not Intubate/DNI  Code status was discussed with the patient and his family at the bedside.  [name] Lucas said  patient has a DNR.  He asked patient what she wanted and she said she wants to be DNR.   Family Communication: Plan discussed with [name] Lucas, son, and GI (daughter-in-law) at the bedside Disposition Plan: Plan to discharge home   Status is: Observation The patient will require care spanning > 2 midnights and should be moved to inpatient because: Severe aortic stenosis, elevated troponins, dizziness       Subjective:   Interval events noted.  No new complaints.  She feels better but she is weak.  [name] Lucas (son) and [place]  (daughter-in-law) were at the bedside  Objective:    Vitals:   12/30/23 2014 12/30/23 2313 12/31/23 0405 12/31/23 0821  BP: (!) 125/53 (!) 111/55 (!) 132/52 (!) 151/57  Pulse: 63 68 68 74  Resp: 17 15 15 19   Temp: 98.5 F (36.9 C) 98.4 F (36.9 C) 98.6 F (37 C) 98 F (36.7 C)  TempSrc: Oral Oral Oral   SpO2: 96% 96% 100% 96%  Weight:      Height:       No data found.   Intake/Output Summary (Last 24 hours) at 12/31/2023 1412 Last data filed at 12/30/2023 2254 Gross per 24 hour  Intake 230.48 ml  Output --  Net 230.48 ml   Filed Weights   12/29/23 2015  Weight: 61.2 kg    Exam:   GEN: NAD SKIN: Warm and dry EYES: Pale but anicteric ENT: MMM CV: RRR PULM: CTA B ABD: soft, ND, NT, +BS CNS: AAO x 3, non focal EXT: No edema or tenderness         Data Reviewed:   I have personally reviewed following labs and imaging studies:  Labs: Labs show the following:   Basic Metabolic Panel: Recent Labs  Lab 12/29/23 2023 12/31/23 0147  NA 139  --   K 4.3  --   CL 107  --   CO2 22  --   GLUCOSE 148*  --   BUN 20  --   CREATININE 0.91 0.88  CALCIUM 9.3  --    GFR Estimated Creatinine Clearance: 35.9 mL/min (by C-G formula based on SCr of 0.88 mg/dL). Liver Function Tests: No results for input(s): "AST", "ALT", "ALKPHOS", "BILITOT", "PROT", "ALBUMIN" in the last 168 hours. No results for input(s): "LIPASE", "AMYLASE" in the last  168 hours. No results for input(s): "AMMONIA" in the last 168 hours. Coagulation profile No results for input(s): "INR", "PROTIME" in the last 168 hours.  CBC: Recent Labs  Lab 12/29/23 2023 12/30/23 1306 12/31/23 0147  WBC 6.1 6.4 5.6  HGB 9.0* 8.4* 7.9*  HCT 28.5* 26.5* 24.9*  MCV 100.4* 100.0 100.0  PLT 168 155 147*   Cardiac Enzymes: No results for input(s): "CKTOTAL", "CKMB", "CKMBINDEX", "TROPONINI" in the last 168 hours. BNP (last 3 results) No results for input(s): "PROBNP" in the  last 8760 hours. CBG: Recent Labs  Lab 12/30/23 0031 12/30/23 0506 12/30/23 0749 12/30/23 1133  GLUCAP 104* 95 138* 99   D-Dimer: No results for input(s): "DDIMER" in the last 72 hours. Hgb A1c: Recent Labs    12/29/23 2332  HGBA1C 5.0   Lipid Profile: Recent Labs    12/31/23 0147  CHOL 204*  HDL 45  LDLCALC 138*  TRIG 104  CHOLHDL 4.5   Thyroid function studies: Recent Labs    12/30/23 0508  TSH 4.325   Anemia work up: Recent Labs    12/30/23 0507 12/30/23 1555  VITAMINB12  --  615  FERRITIN 20  --   TIBC 316  --   IRON 25*  --    Sepsis Labs: Recent Labs  Lab 12/29/23 2023 12/30/23 1306 12/31/23 0147  WBC 6.1 6.4 5.6    Microbiology No results found for this or any previous visit (from the past 240 hours).  Procedures and diagnostic studies:  ECHOCARDIOGRAM COMPLETE Result Date: 12/30/2023    ECHOCARDIOGRAM REPORT   Patient Name:   KELLENE MCCLEARY Date of Exam: 12/30/2023 Medical Rec #:  [id number]      Height:       63.0 in Accession #:    [id number]     Weight:       135.0 lb Date of Birth:  11-20-1931       BSA:          1.636 m Patient Age:    89 years       BP:           145/67 mmHg Patient Gender: F              HR:           75 bpm. Exam Location:  [facility] Procedure: 2D Echo, Cardiac Doppler and Color Doppler (Both Spectral and Color            Flow Doppler were utilized during procedure). Indications:     NSTEMI I21.4  History:         Patient has no  prior history of Echocardiogram examinations.                  Risk Factors:Hypertension.  Sonographer:     Kathaleen Pale Roar Referring Phys:  [id number] Sheril Dines Diagnosing Phys: Timothy Gollan MD IMPRESSIONS  1. Left ventricular ejection fraction, by estimation, is 55 to 60%. Left ventricular ejection fraction by PLAX is 56 %. The left ventricle has normal function. The left ventricle has no regional wall motion abnormalities. There is moderate left ventricular hypertrophy. Left ventricular diastolic parameters are indeterminate.  2. Right ventricular systolic function is normal. The right ventricular size is normal. There is moderately elevated pulmonary artery systolic pressure. The estimated right ventricular systolic pressure is 49.9 mmHg.  3. Left atrial size was mild to moderately dilated.  4. The mitral valve is normal in structure. Moderate mitral valve regurgitation. No evidence of mitral stenosis. The mean mitral valve gradient is 4.0 mmHg. Moderate mitral annular calcification.  5. Tricuspid valve regurgitation is mild to moderate.  6. The aortic valve is calcified. Aortic valve regurgitation is mild. Severe aortic valve stenosis. Aortic valve area, by VTI measures 0.68 cm. Aortic valve mean gradient measures 51.0 mmHg. Aortic valve Vmax measures 4.64 m/s.  7. The inferior vena cava is normal in size with greater than 50% respiratory variability, suggesting right atrial pressure of 3 mmHg. FINDINGS  Left Ventricle: Left ventricular  ejection fraction, by estimation, is 55 to 60%. Left ventricular ejection fraction by PLAX is 56 %. The left ventricle has normal function. The left ventricle has no regional wall motion abnormalities. Strain was performed and the global longitudinal strain is indeterminate. The left ventricular internal cavity size was normal in size. There is moderate left ventricular hypertrophy. Left ventricular diastolic parameters are indeterminate. Right Ventricle: The right ventricular  size is normal. No increase in right ventricular wall thickness. Right ventricular systolic function is normal. There is moderately elevated pulmonary artery systolic pressure. The tricuspid regurgitant velocity is 3.35 m/s, and with an assumed right atrial pressure of 5 mmHg, the estimated right ventricular systolic pressure is 49.9 mmHg. Left Atrium: Left atrial size was mild to moderately dilated. Right Atrium: Right atrial size was normal in size. Pericardium: There is no evidence of pericardial effusion. Mitral Valve: The mitral valve is normal in structure. Moderate mitral annular calcification. Moderate mitral valve regurgitation. No evidence of mitral valve stenosis. MV peak gradient, 12.4 mmHg. The mean mitral valve gradient is 4.0 mmHg. Tricuspid Valve: The tricuspid valve is normal in structure. Tricuspid valve regurgitation is mild to moderate. No evidence of tricuspid stenosis. Aortic Valve: The aortic valve is calcified. Aortic valve regurgitation is mild. Aortic regurgitation PHT measures 568 msec. Severe aortic stenosis is present. Aortic valve mean gradient measures 51.0 mmHg. Aortic valve peak gradient measures 85.9 mmHg. Aortic valve area, by VTI measures 0.68 cm. Pulmonic Valve: The pulmonic valve was normal in structure. Pulmonic valve regurgitation is not visualized. No evidence of pulmonic stenosis. Aorta: The aortic root is normal in size and structure. Venous: The inferior vena cava is normal in size with greater than 50% respiratory variability, suggesting right atrial pressure of 3 mmHg. IAS/Shunts: No atrial level shunt detected by color flow Doppler. Additional Comments: 3D was performed not requiring image post processing on an independent workstation and was indeterminate.  LEFT VENTRICLE PLAX 2D LV EF:         Left            Diastology                ventricular     LV e' medial:    3.64 cm/s                ejection        LV E/e' medial:  48.1                fraction by     LV e'  lateral:   6.25 cm/s                PLAX is 56      LV E/e' lateral: 28.0                %. LVIDd:         4.50 cm LVIDs:         3.20 cm LV PW:         1.50 cm LV IVS:        1.70 cm LVOT diam:     1.60 cm LV SV:         82 LV SV Index:   50 LVOT Area:     2.01 cm  RIGHT VENTRICLE RV Basal diam:  3.00 cm RV Mid diam:    2.60 cm RV S prime:     9.65 cm/s TAPSE (M-mode): 1.9 cm LEFT ATRIUM  Index        RIGHT ATRIUM           Index LA diam:        4.50 cm 2.75 cm/m   RA Area:     16.60 cm LA Vol (A2C):   74.1 ml 45.29 ml/m  RA Volume:   43.10 ml  26.34 ml/m LA Vol (A4C):   81.7 ml 49.93 ml/m LA Biplane Vol: 78.9 ml 48.22 ml/m  AORTIC VALVE                     PULMONIC VALVE AV Area (Vmax):    0.73 cm      PV Vmax:          1.22 m/s AV Area (Vmean):   0.64 cm      PV Peak grad:     6.0 mmHg AV Area (VTI):     0.68 cm      PR End Diast Vel: 3.70 msec AV Vmax:           463.50 cm/s   RVOT Peak grad:   1 mmHg AV Vmean:          338.500 cm/s AV VTI:            1.205 m AV Peak Grad:      85.9 mmHg AV Mean Grad:      51.0 mmHg LVOT Vmax:         169.00 cm/s LVOT Vmean:        107.000 cm/s LVOT VTI:          0.406 m LVOT/AV VTI ratio: 0.34 AI PHT:            568 msec  AORTA Ao Root diam: 2.60 cm Ao Asc diam:  2.90 cm MITRAL VALVE                TRICUSPID VALVE MV Area (PHT): 4.31 cm     TR Peak grad:   44.9 mmHg MV Area VTI:   2.06 cm     TR Vmax:        335.00 cm/s MV Peak grad:  12.4 mmHg MV Mean grad:  4.0 mmHg     SHUNTS MV Vmax:       1.76 m/s     Systemic VTI:  0.41 m MV Vmean:      95.7 cm/s    Systemic Diam: 1.60 cm MV Decel Time: 176 msec MV E velocity: 175.00 cm/s MV A velocity: 112.00 cm/s MV E/A ratio:  1.56 MV A Prime:    4.8 cm/s Belva Boyden MD Electronically signed by Belva Boyden MD Signature Date/Time: 12/30/2023/1:00:12 PM    Final (Updated)    CT Head Wo Contrast Result Date: 12/29/2023 CLINICAL DATA:  Headaches and neck pain, history of fall several weeks ago, initial  encounter EXAM: CT HEAD WITHOUT CONTRAST CT CERVICAL SPINE WITHOUT CONTRAST TECHNIQUE: Multidetector CT imaging of the head and cervical spine was performed following the standard protocol without intravenous contrast. Multiplanar CT image reconstructions of the cervical spine were also generated. RADIATION DOSE REDUCTION: This exam was performed according to the departmental dose-optimization program which includes automated exposure control, adjustment of the mA and/or kV according to patient size and/or use of iterative reconstruction technique. COMPARISON:  None Available. FINDINGS: CT HEAD FINDINGS Brain: No evidence of acute infarction, hemorrhage, hydrocephalus, extra-axial collection or mass lesion/mass effect. Chronic atrophic and ischemic changes are noted. Vascular: No hyperdense vessel or unexpected  calcification. Skull: Normal. Negative for fracture or focal lesion. Sinuses/Orbits: No acute finding. Other: None. CT CERVICAL SPINE FINDINGS Alignment: Mild degenerative anterolisthesis of C4 on C5 is noted. Alignment is otherwise within normal limits. Skull base and vertebrae: 7 cervical segments are well visualized. Multilevel osteophytic change and facet hypertrophic changes are noted. No acute fracture or acute facet abnormality is noted. The odontoid is within normal limits. Soft tissues and spinal canal: Surrounding soft tissue structures are within normal limits. Vascular calcifications seen. Upper chest: Visualized lung apices are unremarkable. Other: None IMPRESSION: CT of the head: Chronic atrophic and ischemic changes. CT of the cervical spine: Mild degenerative change without acute abnormality. Electronically Signed   By: Violeta Grey M.D.   On: 12/29/2023 22:56   CT Cervical Spine Wo Contrast Result Date: 12/29/2023 CLINICAL DATA:  Headaches and neck pain, history of fall several weeks ago, initial encounter EXAM: CT HEAD WITHOUT CONTRAST CT CERVICAL SPINE WITHOUT CONTRAST TECHNIQUE:  Multidetector CT imaging of the head and cervical spine was performed following the standard protocol without intravenous contrast. Multiplanar CT image reconstructions of the cervical spine were also generated. RADIATION DOSE REDUCTION: This exam was performed according to the departmental dose-optimization program which includes automated exposure control, adjustment of the mA and/or kV according to patient size and/or use of iterative reconstruction technique. COMPARISON:  None Available. FINDINGS: CT HEAD FINDINGS Brain: No evidence of acute infarction, hemorrhage, hydrocephalus, extra-axial collection or mass lesion/mass effect. Chronic atrophic and ischemic changes are noted. Vascular: No hyperdense vessel or unexpected calcification. Skull: Normal. Negative for fracture or focal lesion. Sinuses/Orbits: No acute finding. Other: None. CT CERVICAL SPINE FINDINGS Alignment: Mild degenerative anterolisthesis of C4 on C5 is noted. Alignment is otherwise within normal limits. Skull base and vertebrae: 7 cervical segments are well visualized. Multilevel osteophytic change and facet hypertrophic changes are noted. No acute fracture or acute facet abnormality is noted. The odontoid is within normal limits. Soft tissues and spinal canal: Surrounding soft tissue structures are within normal limits. Vascular calcifications seen. Upper chest: Visualized lung apices are unremarkable. Other: None IMPRESSION: CT of the head: Chronic atrophic and ischemic changes. CT of the cervical spine: Mild degenerative change without acute abnormality. Electronically Signed   By: Violeta Grey M.D.   On: 12/29/2023 22:56   DG Chest 1 View Result Date: 12/29/2023 CLINICAL DATA:  Headaches EXAM: PORTABLE CHEST 1 VIEW COMPARISON:  11/26/2021 FINDINGS: Check shadow is enlarged but stable. Aortic calcifications are noted. The lungs are well aerated bilaterally. Mild interstitial changes are seen with mild edema. Old rib fractures on the left  are noted. IMPRESSION: Mild interstitial edema. Electronically Signed   By: Violeta Grey M.D.   On: 12/29/2023 20:42               LOS: 0 days   Chi Woodham  [facility]   Pager on [email or web address]. If 7PM-7AM, please contact night-coverage at [email or web address]     12/31/2023, 2:12 PM

## 2024-01-01 DIAGNOSIS — I350 Nonrheumatic aortic (valve) stenosis: Secondary | ICD-10-CM | POA: Diagnosis not present

## 2024-01-01 DIAGNOSIS — R42 Dizziness and giddiness: Secondary | ICD-10-CM | POA: Diagnosis not present

## 2024-01-01 LAB — TYPE AND SCREEN
ABO/RH(D): O POS
Antibody Screen: NEGATIVE
Unit division: 0

## 2024-01-01 LAB — CBC
HCT: 29.7 % — ABNORMAL LOW (ref 36.0–46.0)
Hemoglobin: 9.6 g/dL — ABNORMAL LOW (ref 12.0–15.0)
MCH: 31.7 pg (ref 26.0–34.0)
MCHC: 32.3 g/dL (ref 30.0–36.0)
MCV: 98 fL (ref 80.0–100.0)
Platelets: 159 10*3/uL (ref 150–400)
RBC: 3.03 MIL/uL — ABNORMAL LOW (ref 3.87–5.11)
RDW: 14.2 % (ref 11.5–15.5)
WBC: 5 10*3/uL (ref 4.0–10.5)
nRBC: 0 % (ref 0.0–0.2)

## 2024-01-01 LAB — BPAM RBC
Blood Product Expiration Date: 202506192359
ISSUE DATE / TIME: 202505241527
Unit Type and Rh: 5100

## 2024-01-01 NOTE — Plan of Care (Signed)
  Problem: Clinical Measurements: Goal: Ability to maintain clinical measurements within normal limits will improve Outcome: Progressing Goal: Will remain free from infection Outcome: Progressing Goal: Respiratory complications will improve Outcome: Progressing Goal: Cardiovascular complication will be avoided Outcome: Progressing   Problem: Cardiac: Goal: Ability to achieve and maintain adequate cardiovascular perfusion will improve Outcome: Progressing   Problem: Pain Managment: Goal: General experience of comfort will improve and/or be controlled Outcome: Progressing   Problem: Safety: Goal: Ability to remain free from injury will improve Outcome: Progressing

## 2024-01-01 NOTE — Plan of Care (Signed)
   Problem: Education: Goal: Knowledge of General Education information will improve Description: Including pain rating scale, medication(s)/side effects and non-pharmacologic comfort measures Outcome: Progressing   Problem: Activity: Goal: Risk for activity intolerance will decrease Outcome: Progressing   Problem: Nutrition: Goal: Adequate nutrition will be maintained Outcome: Progressing

## 2024-01-01 NOTE — Progress Notes (Signed)
 Rounding Note    Patient Name: [name] Lucas Date of Encounter: 01/01/2024  [facility] Cardiologist: Antionette Kirks, MD   Subjective   Family at bedside today, had extensive family discussion, see summary below. Family today notes that she has been more short of breath just even getting a shower in the shower chair. Has also had the lightheadedness. Discussed that given her relative immobility, may be difficult to assess her symptom burden.  Inpatient Medications    Scheduled Meds:  aspirin  EC  81 mg Oral Daily   heparin   5,000 Units Subcutaneous Q8H   pantoprazole   40 mg Oral Daily   Continuous Infusions:  PRN Meds: acetaminophen , nitroGLYCERIN, ondansetron  (ZOFRAN ) IV   Vital Signs    Vitals:   12/31/23 2356 01/01/24 0310 01/01/24 0840 01/01/24 0952  BP: (!) 137/48 (!) 137/57 (!) 162/73 (!) 134/47  Pulse: (!) 51 (!) 53 72   Resp: 20 15    Temp: 97.7 F (36.5 C) (!) 97.5 F (36.4 C) 98.2 F (36.8 C)   TempSrc: Axillary Oral    SpO2: 99% 97% 94%   Weight:      Height:        Intake/Output Summary (Last 24 hours) at 01/01/2024 1106 Last data filed at 01/01/2024 1000 Gross per 24 hour  Intake 1006 ml  Output --  Net 1006 ml      12/29/2023    8:15 PM 05/23/2023    7:31 AM 05/12/2023    2:06 PM  Last 3 Weights  Weight (lbs) 135 lb 135 lb 135 lb  Weight (kg) 61.236 kg 61.236 kg 61.236 kg      Telemetry    SR - Personally Reviewed  Physical Exam   GEN: Well nourished, well developed in no acute distress NECK: No JVD appreciated CARDIAC: regular rhythm, normal S1 and S2, no rubs or gallops. 3/6 SEM, late peaking, quiet but audible S2 VASCULAR: Radial pulses 2+ bilaterally.  RESPIRATORY:  Clear to auscultation without rales, wheezing or rhonchi  ABDOMEN: Soft, non-tender, non-distended MUSCULOSKELETAL:  Moves all 4 limbs independently SKIN: Warm and dry, no edema NEUROLOGIC:  No focal neuro deficits noted. PSYCHIATRIC:  Normal affect     New pertinent results (labs, ECG, imaging, cardiac studies)    Personally reviewed her echo images and showed family at bedside today, reviewed findings together. My interpretation: Preserved LVEF, severe aortic stenosis (AVA 0.68, Vmax 4.64, mean gradient 51), elevated PA pressures to 50, moderate MR, mild-moderate TR, RAP 3. Cannot assess diastology due to MAC.   Assessment & Plan    Elevated troponin: not consistent with ACS. Likely exacerbated by severe aortic stenosis and acute anemia, consistent with demand. No plans for ischemic evaluation. Heparin  stopped.  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 No macroscopic GI bleeding noted. 2 years ago Hgb was 13.6. On arrival here it was 9.0 and yesterday was 7.9. Now S/P transfusion and Hgb 9.6. Heparin  stopped when Hgb dropped, clopidogrel  on hold, tolerating aspirin .  We discussed again with family how severe AS can cause more common bleeding from angiodyplasia and that often it is not possible to spot treat all of these lesions; we instead try to limit medications that can increase risk of bleeding. ,    Severe aortic stenosis: We had extensive family conversation at bedside today. I reviewed her actual echo images with them, discussed again what aortic stenosis is, natural history of this, symptoms that we see and specifically reviewed watching for  shortness of breath/fluid retention, chest pain, and syncope.  She is minimally functional at baseline. She is DNR and DNI. We discussed TAVR previously and again today. She does not wish to pursue.  Difficult to assess what her current symptom burden is as she is largely bedbound, but the lightheadedness could be presyncope.   We discussed palliative care and hospice, how they can assist with symptom management and in the case of hospice, help with managing needs to keep patient at home and prevent hospitalizations. They are familiar with these services as they had used them with other  family members. They would be interested in meeting with palliative care as an outpatient. They are very focused on keeping her comfortable.  PAD with history of critical limb ischemia requiring revascularization: continue aspirin  as long as there is no hemodynamically significant bleeding. Agree with holding clopidogrel , would not restart at discharge. She has hyperlipidemia but is statin intolerant. Given severe AS as life limiting comorbidity, reasonable to not pursue alternative cholesterol therapy at this time.  Cardiology will sign off. Communicated with Dr. Leory Rands ok for discharge from our perspective. Do not restart clopidogrel  at discharge. Will arrange for office follow up.  Total time of encounter: I spent 55 minutes dedicated to the care of this patient on the date of this encounter to include pre-visit review of records, face-to-face time with the patient discussing conditions above, and clinical documentation with the electronic health record. We specifically spent time today discussing aortic stenosis extensively as noted above, discussed goals of care and palliative care/hospice.     Signed, Sheryle Donning, MD  01/01/2024, 11:06 AM

## 2024-01-01 NOTE — TOC CM/SW Note (Signed)
 Transition of Care [facility]) - Inpatient Brief Assessment   Patient Details  Name: [name] Lucas MRN: [id number] Date of Birth: 13-Jun-1932  Transition of Care [facility]) CM/SW Contact:    Holland Lundborg, RN Phone Number: 01/01/2024, 12:19 PM   Clinical Narrative:  Patient with planned discharge today.  Spoke with daughter Shelvy Dickens, who patient lives with, she will provide transportation home.  She state palliative care referral for outpatient services was requested, preference is [facility], Olevia Bers notified.  No further TOC needs.   Transition of Care Asessment: Insurance and Status: Insurance coverage has been reviewed Patient has primary care physician: Yes Home environment has been reviewed: with daughter Prior level of function:: daughter assists Prior/Current Home Services: No current home services Social Drivers of Health Review: SDOH reviewed no interventions necessary Readmission risk has been reviewed: Yes Transition of care needs: no transition of care needs at this time

## 2024-01-01 NOTE — Discharge Summary (Addendum)
 Physician Discharge Summary   Patient: [name] Lucas MRN: [id number] DOB: 10-18-1931  Admit date:     12/29/2023  Discharge date: 01/01/24  Discharge Physician: Sheril Dines   PCP: Jimmy Moulding, MD   Recommendations at discharge:   Follow-up with palliative care team as an outpatient.  Office will call to schedule appointment) Follow-up with [facility] cardiologist (office will call to schedule appointment)  Discharge Diagnoses: Principal Problem:   Severe aortic stenosis Active Problems:   Benign essential hypertension   Type 2 diabetes mellitus (HCC)   Headache   Sinus bradycardia   Dizziness   Demand ischemia (HCC)   Cervical spine degeneration   Nonischemic nontraumatic myocardial injury  Resolved Problems:   NSTEMI (non-ST elevated myocardial infarction) [facility])  Hospital Course:  [name] Lucas is a 89 y.o. female with medical history significant for type II DM, hypertension, PAD, who presented to the hospital with headache, neck pain and dizzy spells.  Headache is usually around the back of her head.  Headache and neck pain have been going on for some time now.  She's also had dizzy spells intermittently for some time now. In the ED, she was found to have bradycardia with heart rate in the 50s and also had sinus pauses on telemetry.  Troponins were elevated from 43-203.   She was admitted to the hospital for further workup.    Assessment and Plan:   Elevated troponins: Troponin up from 43-103-562.  No chest pain or shortness of breath.  Probably due to demand ischemia.  She was briefly treated with IV heparin .    Sinus bradycardia with sinus pauses: Intermittent bradycardia noted on telemetry.  It does not correlate with symptoms and patient did not report any dizziness today.     Recent dizziness: May be multifactorial from anemia, orthostatic hypotension and bradycardia which could have been due to severe aortic stenosis   Orthostatic hypotension: Lying  BP 134/47, sitting BP 167/92, standing BP at 0 minutes 130/69 and standing BP at 3 minutes 145/90   Severe aortic stenosis, valve heart disease: This may be causing her dizziness.  Patient not an ideal candidate for TAVR and patient has decided against TAVR or any aggressive measures.   2D echo showed EF estimated at 55 to 60%, moderate LVH, indeterminate LV diastolic parameters, moderately elevated pulmonary artery systolic pressure, moderate mitral regurgitation, mild to moderate tricuspid regurgitation.     Degenerative disc disease of the cervical spine: This could be the cause of her headaches and neck pain.  Analgesics as needed for pain.     Acute on chronic anemia, iron deficiency anemia:  This may be related to severe aortic stenosis. S/p transfusion 1 unit of PRBCs.  Hemoglobin up from 7.9-9.6. Hemoglobin was 9.0 on admission. Iron studies consistent with iron deficiency anemia (iron 25, TIBC 316, saturation ratio 8, ferritin 20).  Vitamin B12 level 615. Of note, [name] said patient had complications during endoscopy a few years ago and they are not interested in any endoscopic evaluation.     Peripheral vascular disease: She was on aspirin  and Plavix . Plavix  has been discontinued because of increased risk of bleeding complications.  She will continue low-dose aspirin  at discharge.  Family agreeable with the plan.     Hyperlipidemia, statin intolerance: Repeat lipid panel (total cholesterol 204, triglycerides 104, HDL 45, LDL 138).   LDL was 132, triglycerides 257 and total cholesterol 227 in September 2024.   No plans for PCSK9 inhibitor in the outpatient  setting since she desires palliative care and possibly hospice in the near future     Type II DM: Glucose levels are stable.  Hemoglobin A1c 5.0.       Outpatient referral for palliative care order has been placed.   She is asymptomatic at this time and she is deemed stable for discharge from cardiology standpoint.    Discharge plan was discussed with [name] (son), Shelvy Dickens (daughter) and [place]  (daughter-in-law) at the bedside.  They agree with the plan.      Consultants: Cardiologist Procedures performed: None Disposition: Home Diet recommendation:  Discharge Diet Orders (From admission, onward)     Start     Ordered   01/01/24 0000  Diet - low sodium heart healthy        01/01/24 1128           Cardiac diet DISCHARGE MEDICATION: Allergies as of 01/01/2024       Reactions   Amoxicillin Other (See Comments)   sick  sick sick   Ciprofloxacin  Other (See Comments)   sick   Felodipine Other (See Comments)   n/v  n/v n/v   Statins Other (See Comments)   Sulfa Antibiotics Nausea And Vomiting        Medication List     STOP taking these medications    clopidogrel  75 MG tablet Commonly known as: Plavix        TAKE these medications    acetaminophen  500 MG tablet Commonly known as: TYLENOL  Take 1,000 mg by mouth every 6 (six) hours as needed.   albuterol  108 (90 Base) MCG/ACT inhaler Commonly known as: VENTOLIN  HFA Inhale 2 puffs into the lungs every 6 (six) hours as needed for wheezing or shortness of breath.   aspirin  EC 81 MG tablet Take 1 tablet (81 mg total) by mouth daily. Swallow whole.   [employer] VITAMIN B12 1000 MCG tablet Generic drug: cyanocobalamin Take 1,000 mcg by mouth daily.   gabapentin 100 MG capsule Commonly known as: NEURONTIN Take 100 mg by mouth 2 (two) times daily.   omeprazole 20 MG capsule Commonly known as: PRILOSEC Take 20 mg by mouth daily.   potassium chloride  10 MEQ tablet Commonly known as: KLOR-CON  Take 10 mEq by mouth at bedtime.   Vitamin D3 50 MCG (2000 UT) capsule Take 2,000 Units by mouth daily.        Discharge Exam: Filed Weights   12/29/23 2015  Weight: 61.2 kg   GEN: NAD SKIN: Warm and dry EYES: No pallor or icterus ENT: MMM CV: RRR PULM: CTA B ABD: soft, ND, NT, +BS CNS: AAO x 3, non focal EXT: No edema  or tenderness   Condition at discharge: good  The results of significant diagnostics from this hospitalization (including imaging, microbiology, ancillary and laboratory) are listed below for reference.   Imaging Studies: ECHOCARDIOGRAM COMPLETE Result Date: 12/30/2023    ECHOCARDIOGRAM REPORT   Patient Name:   [name] Lucas Date of Exam: 12/30/2023 Medical Rec #:  [id number]      Height:       63.0 in Accession #:    [id number]     Weight:       135.0 lb Date of Birth:  April 13, 1932       BSA:          1.636 m Patient Age:    89 years       BP:           145/67 mmHg Patient Gender: F  HR:           75 bpm. Exam Location:  [facility] Procedure: 2D Echo, Cardiac Doppler and Color Doppler (Both Spectral and Color            Flow Doppler were utilized during procedure). Indications:     NSTEMI I21.4  History:         Patient has no prior history of Echocardiogram examinations.                  Risk Factors:Hypertension.  Sonographer:     Kathaleen Pale Roar Referring Phys:  [id number] Sheril Dines Diagnosing Phys: Timothy Gollan MD IMPRESSIONS  1. Left ventricular ejection fraction, by estimation, is 55 to 60%. Left ventricular ejection fraction by PLAX is 56 %. The left ventricle has normal function. The left ventricle has no regional wall motion abnormalities. There is moderate left ventricular hypertrophy. Left ventricular diastolic parameters are indeterminate.  2. Right ventricular systolic function is normal. The right ventricular size is normal. There is moderately elevated pulmonary artery systolic pressure. The estimated right ventricular systolic pressure is 49.9 mmHg.  3. Left atrial size was mild to moderately dilated.  4. The mitral valve is normal in structure. Moderate mitral valve regurgitation. No evidence of mitral stenosis. The mean mitral valve gradient is 4.0 mmHg. Moderate mitral annular calcification.  5. Tricuspid valve regurgitation is mild to moderate.  6. The aortic valve is calcified.  Aortic valve regurgitation is mild. Severe aortic valve stenosis. Aortic valve area, by VTI measures 0.68 cm. Aortic valve mean gradient measures 51.0 mmHg. Aortic valve Vmax measures 4.64 m/s.  7. The inferior vena cava is normal in size with greater than 50% respiratory variability, suggesting right atrial pressure of 3 mmHg. FINDINGS  Left Ventricle: Left ventricular ejection fraction, by estimation, is 55 to 60%. Left ventricular ejection fraction by PLAX is 56 %. The left ventricle has normal function. The left ventricle has no regional wall motion abnormalities. Strain was performed and the global longitudinal strain is indeterminate. The left ventricular internal cavity size was normal in size. There is moderate left ventricular hypertrophy. Left ventricular diastolic parameters are indeterminate. Right Ventricle: The right ventricular size is normal. No increase in right ventricular wall thickness. Right ventricular systolic function is normal. There is moderately elevated pulmonary artery systolic pressure. The tricuspid regurgitant velocity is 3.35 m/s, and with an assumed right atrial pressure of 5 mmHg, the estimated right ventricular systolic pressure is 49.9 mmHg. Left Atrium: Left atrial size was mild to moderately dilated. Right Atrium: Right atrial size was normal in size. Pericardium: There is no evidence of pericardial effusion. Mitral Valve: The mitral valve is normal in structure. Moderate mitral annular calcification. Moderate mitral valve regurgitation. No evidence of mitral valve stenosis. MV peak gradient, 12.4 mmHg. The mean mitral valve gradient is 4.0 mmHg. Tricuspid Valve: The tricuspid valve is normal in structure. Tricuspid valve regurgitation is mild to moderate. No evidence of tricuspid stenosis. Aortic Valve: The aortic valve is calcified. Aortic valve regurgitation is mild. Aortic regurgitation PHT measures 568 msec. Severe aortic stenosis is present. Aortic valve mean gradient  measures 51.0 mmHg. Aortic valve peak gradient measures 85.9 mmHg. Aortic valve area, by VTI measures 0.68 cm. Pulmonic Valve: The pulmonic valve was normal in structure. Pulmonic valve regurgitation is not visualized. No evidence of pulmonic stenosis. Aorta: The aortic root is normal in size and structure. Venous: The inferior vena cava is normal in size with greater than 50% respiratory variability, suggesting  right atrial pressure of 3 mmHg. IAS/Shunts: No atrial level shunt detected by color flow Doppler. Additional Comments: 3D was performed not requiring image post processing on an independent workstation and was indeterminate.  LEFT VENTRICLE PLAX 2D LV EF:         Left            Diastology                ventricular     LV e' medial:    3.64 cm/s                ejection        LV E/e' medial:  48.1                fraction by     LV e' lateral:   6.25 cm/s                PLAX is 56      LV E/e' lateral: 28.0                %. LVIDd:         4.50 cm LVIDs:         3.20 cm LV PW:         1.50 cm LV IVS:        1.70 cm LVOT diam:     1.60 cm LV SV:         82 LV SV Index:   50 LVOT Area:     2.01 cm  RIGHT VENTRICLE RV Basal diam:  3.00 cm RV Mid diam:    2.60 cm RV S prime:     9.65 cm/s TAPSE (M-mode): 1.9 cm LEFT ATRIUM             Index        RIGHT ATRIUM           Index LA diam:        4.50 cm 2.75 cm/m   RA Area:     16.60 cm LA Vol (A2C):   74.1 ml 45.29 ml/m  RA Volume:   43.10 ml  26.34 ml/m LA Vol (A4C):   81.7 ml 49.93 ml/m LA Biplane Vol: 78.9 ml 48.22 ml/m  AORTIC VALVE                     PULMONIC VALVE AV Area (Vmax):    0.73 cm      PV Vmax:          1.22 m/s AV Area (Vmean):   0.64 cm      PV Peak grad:     6.0 mmHg AV Area (VTI):     0.68 cm      PR End Diast Vel: 3.70 msec AV Vmax:           463.50 cm/s   RVOT Peak grad:   1 mmHg AV Vmean:          338.500 cm/s AV VTI:            1.205 m AV Peak Grad:      85.9 mmHg AV Mean Grad:      51.0 mmHg LVOT Vmax:         169.00 cm/s LVOT  Vmean:        107.000 cm/s LVOT VTI:          0.406 m LVOT/AV VTI ratio: 0.34 AI PHT:  568 msec  AORTA Ao Root diam: 2.60 cm Ao Asc diam:  2.90 cm MITRAL VALVE                TRICUSPID VALVE MV Area (PHT): 4.31 cm     TR Peak grad:   44.9 mmHg MV Area VTI:   2.06 cm     TR Vmax:        335.00 cm/s MV Peak grad:  12.4 mmHg MV Mean grad:  4.0 mmHg     SHUNTS MV Vmax:       1.76 m/s     Systemic VTI:  0.41 m MV Vmean:      95.7 cm/s    Systemic Diam: 1.60 cm MV Decel Time: 176 msec MV E velocity: 175.00 cm/s MV A velocity: 112.00 cm/s MV E/A ratio:  1.56 MV A Prime:    4.8 cm/s Belva Boyden MD Electronically signed by Belva Boyden MD Signature Date/Time: 12/30/2023/1:00:12 PM    Final (Updated)    CT Head Wo Contrast Result Date: 12/29/2023 CLINICAL DATA:  Headaches and neck pain, history of fall several weeks ago, initial encounter EXAM: CT HEAD WITHOUT CONTRAST CT CERVICAL SPINE WITHOUT CONTRAST TECHNIQUE: Multidetector CT imaging of the head and cervical spine was performed following the standard protocol without intravenous contrast. Multiplanar CT image reconstructions of the cervical spine were also generated. RADIATION DOSE REDUCTION: This exam was performed according to the departmental dose-optimization program which includes automated exposure control, adjustment of the mA and/or kV according to patient size and/or use of iterative reconstruction technique. COMPARISON:  None Available. FINDINGS: CT HEAD FINDINGS Brain: No evidence of acute infarction, hemorrhage, hydrocephalus, extra-axial collection or mass lesion/mass effect. Chronic atrophic and ischemic changes are noted. Vascular: No hyperdense vessel or unexpected calcification. Skull: Normal. Negative for fracture or focal lesion. Sinuses/Orbits: No acute finding. Other: None. CT CERVICAL SPINE FINDINGS Alignment: Mild degenerative anterolisthesis of C4 on C5 is noted. Alignment is otherwise within normal limits. Skull base and  vertebrae: 7 cervical segments are well visualized. Multilevel osteophytic change and facet hypertrophic changes are noted. No acute fracture or acute facet abnormality is noted. The odontoid is within normal limits. Soft tissues and spinal canal: Surrounding soft tissue structures are within normal limits. Vascular calcifications seen. Upper chest: Visualized lung apices are unremarkable. Other: None IMPRESSION: CT of the head: Chronic atrophic and ischemic changes. CT of the cervical spine: Mild degenerative change without acute abnormality. Electronically Signed   By: Violeta Grey M.D.   On: 12/29/2023 22:56   CT Cervical Spine Wo Contrast Result Date: 12/29/2023 CLINICAL DATA:  Headaches and neck pain, history of fall several weeks ago, initial encounter EXAM: CT HEAD WITHOUT CONTRAST CT CERVICAL SPINE WITHOUT CONTRAST TECHNIQUE: Multidetector CT imaging of the head and cervical spine was performed following the standard protocol without intravenous contrast. Multiplanar CT image reconstructions of the cervical spine were also generated. RADIATION DOSE REDUCTION: This exam was performed according to the departmental dose-optimization program which includes automated exposure control, adjustment of the mA and/or kV according to patient size and/or use of iterative reconstruction technique. COMPARISON:  None Available. FINDINGS: CT HEAD FINDINGS Brain: No evidence of acute infarction, hemorrhage, hydrocephalus, extra-axial collection or mass lesion/mass effect. Chronic atrophic and ischemic changes are noted. Vascular: No hyperdense vessel or unexpected calcification. Skull: Normal. Negative for fracture or focal lesion. Sinuses/Orbits: No acute finding. Other: None. CT CERVICAL SPINE FINDINGS Alignment: Mild degenerative anterolisthesis of C4 on C5 is noted. Alignment is otherwise  within normal limits. Skull base and vertebrae: 7 cervical segments are well visualized. Multilevel osteophytic change and facet  hypertrophic changes are noted. No acute fracture or acute facet abnormality is noted. The odontoid is within normal limits. Soft tissues and spinal canal: Surrounding soft tissue structures are within normal limits. Vascular calcifications seen. Upper chest: Visualized lung apices are unremarkable. Other: None IMPRESSION: CT of the head: Chronic atrophic and ischemic changes. CT of the cervical spine: Mild degenerative change without acute abnormality. Electronically Signed   By: Violeta Grey M.D.   On: 12/29/2023 22:56   DG Chest 1 View Result Date: 12/29/2023 CLINICAL DATA:  Headaches EXAM: PORTABLE CHEST 1 VIEW COMPARISON:  11/26/2021 FINDINGS: Check shadow is enlarged but stable. Aortic calcifications are noted. The lungs are well aerated bilaterally. Mild interstitial changes are seen with mild edema. Old rib fractures on the left are noted. IMPRESSION: Mild interstitial edema. Electronically Signed   By: Violeta Grey M.D.   On: 12/29/2023 20:42    Microbiology: Results for orders placed or performed during the hospital encounter of 08/31/18  Blood Culture (routine x 2)     Status: None   Collection Time: 08/31/18 10:10 AM   Specimen: BLOOD  Result Value Ref Range Status   Specimen Description BLOOD LEFT [facility]  Final   Special Requests   Final    BOTTLES DRAWN AEROBIC AND ANAEROBIC Blood Culture adequate volume   Culture   Final    NO GROWTH 5 DAYS Performed at [facility], [address] [place], [place] [address]    Report Status 09/05/2018 FINAL  Final  Blood Culture (routine x 2)     Status: None   Collection Time: 08/31/18 10:10 AM   Specimen: BLOOD  Result Value Ref Range Status   Specimen Description BLOOD RIGHT WRIST  Final   Special Requests   Final    BOTTLES DRAWN AEROBIC AND ANAEROBIC Blood Culture adequate volume   Culture   Final    NO GROWTH 5 DAYS Performed at [facility], [address] [place], [place] [address]    Report Status 09/05/2018  FINAL  Final    Labs: CBC: Recent Labs  Lab 12/29/23 2023 12/30/23 1306 12/31/23 0147 01/01/24 0408  WBC 6.1 6.4 5.6 5.0  HGB 9.0* 8.4* 7.9* 9.6*  HCT 28.5* 26.5* 24.9* 29.7*  MCV 100.4* 100.0 100.0 98.0  PLT 168 155 147* 159   Basic Metabolic Panel: Recent Labs  Lab 12/29/23 2023 12/31/23 0147  NA 139  --   K 4.3  --   CL 107  --   CO2 22  --   GLUCOSE 148*  --   BUN 20  --   CREATININE 0.91 0.88  CALCIUM 9.3  --    Liver Function Tests: No results for input(s): "AST", "ALT", "ALKPHOS", "BILITOT", "PROT", "ALBUMIN" in the last 168 hours. CBG: Recent Labs  Lab 12/30/23 0031 12/30/23 0506 12/30/23 0749 12/30/23 1133  GLUCAP 104* 95 138* 99    Discharge time spent: greater than 30 minutes.  Signed: Sheril Dines, MD [facility] 01/01/2024

## 2024-01-01 NOTE — Progress Notes (Signed)
 [facility] OutPatient Palliative Care Note  Received a message from Holland Lundborg, [facility] for outpatient palliative care consult after discharge from the hospital.    Patient will be discharging home.  Patient lives with her daughter.    Patient/Family request palliative to discuss further goals of care conversation that Dr. Sheryle Donning started in the hospital.  Patient with plans to discharge home today.  Referral sent to [facility] Referral Intake  Thank you for allowing participation in this patient's care.  Ambrosio Junker, MA, BSN, RN, FNE Clinical Nurse Liaison [phone]

## 2024-01-01 NOTE — Progress Notes (Signed)
 pt is bradying down and sustaining to 40's while asleep. when alerted pt comes up to 70s, pt asymptomatic, other VSS. Pt came in for CHF exacerbation, and bradycardia. Notify Dr. Vallarie Gauze. No other concern at the moment. Plan of care continued.

## 2024-01-03 LAB — LIPOPROTEIN A (LPA): Lipoprotein (a): <8.4 nmol/L (ref <75.0)

## 2024-02-06 ENCOUNTER — Ambulatory Visit: Admitting: Physician Assistant

## 2024-06-02 ENCOUNTER — Other Ambulatory Visit (INDEPENDENT_AMBULATORY_CARE_PROVIDER_SITE_OTHER): Payer: Self-pay | Admitting: Vascular Surgery
